# Patient Record
Sex: Female | Born: 1975 | Race: White | Hispanic: No | Marital: Married | Smoking: Never smoker
Health system: Southern US, Community
[De-identification: ages and names within clinical notes are randomized; demographics above are authoritative.]

## PROBLEM LIST (undated history)

## (undated) ENCOUNTER — Inpatient Hospital Stay (HOSPITAL_COMMUNITY): Payer: Self-pay

## (undated) DIAGNOSIS — E119 Type 2 diabetes mellitus without complications: Secondary | ICD-10-CM

## (undated) DIAGNOSIS — IMO0001 Reserved for inherently not codable concepts without codable children: Secondary | ICD-10-CM

## (undated) DIAGNOSIS — Z87898 Personal history of other specified conditions: Secondary | ICD-10-CM

## (undated) DIAGNOSIS — M65842 Other synovitis and tenosynovitis, left hand: Secondary | ICD-10-CM

## (undated) DIAGNOSIS — M65319 Trigger thumb, unspecified thumb: Secondary | ICD-10-CM

## (undated) DIAGNOSIS — Z794 Long term (current) use of insulin: Secondary | ICD-10-CM

## (undated) HISTORY — PX: WISDOM TOOTH EXTRACTION: SHX21

## (undated) HISTORY — PX: BREAST BIOPSY: SHX20

---

## 2009-01-21 ENCOUNTER — Inpatient Hospital Stay (HOSPITAL_COMMUNITY): Admission: AD | Admit: 2009-01-21 | Discharge: 2009-01-23 | Payer: Self-pay | Admitting: Obstetrics and Gynecology

## 2009-01-21 ENCOUNTER — Encounter (INDEPENDENT_AMBULATORY_CARE_PROVIDER_SITE_OTHER): Payer: Self-pay | Admitting: Obstetrics and Gynecology

## 2009-10-22 ENCOUNTER — Ambulatory Visit (HOSPITAL_COMMUNITY): Admission: RE | Admit: 2009-10-22 | Discharge: 2009-10-22 | Payer: Self-pay | Admitting: Obstetrics and Gynecology

## 2009-12-24 ENCOUNTER — Inpatient Hospital Stay (HOSPITAL_COMMUNITY): Admission: AD | Admit: 2009-12-24 | Discharge: 2009-12-27 | Payer: Self-pay | Admitting: Obstetrics and Gynecology

## 2009-12-27 ENCOUNTER — Encounter: Admission: RE | Admit: 2009-12-27 | Discharge: 2010-01-03 | Payer: Self-pay | Admitting: Obstetrics & Gynecology

## 2010-06-22 ENCOUNTER — Encounter: Payer: Self-pay | Admitting: Obstetrics and Gynecology

## 2010-08-16 LAB — GLUCOSE, CAPILLARY
Glucose-Capillary: 102 mg/dL — ABNORMAL HIGH (ref 70–99)
Glucose-Capillary: 104 mg/dL — ABNORMAL HIGH (ref 70–99)
Glucose-Capillary: 111 mg/dL — ABNORMAL HIGH (ref 70–99)
Glucose-Capillary: 147 mg/dL — ABNORMAL HIGH (ref 70–99)
Glucose-Capillary: 173 mg/dL — ABNORMAL HIGH (ref 70–99)
Glucose-Capillary: 62 mg/dL — ABNORMAL LOW (ref 70–99)
Glucose-Capillary: 83 mg/dL (ref 70–99)
Glucose-Capillary: 92 mg/dL (ref 70–99)

## 2010-08-16 LAB — CBC
Hemoglobin: 11.4 g/dL — ABNORMAL LOW (ref 12.0–15.0)
MCH: 32.3 pg (ref 26.0–34.0)
MCH: 32.9 pg (ref 26.0–34.0)
MCHC: 33.6 g/dL (ref 30.0–36.0)
MCV: 96.3 fL (ref 78.0–100.0)
MCV: 96.5 fL (ref 78.0–100.0)
RBC: 3.46 MIL/uL — ABNORMAL LOW (ref 3.87–5.11)
RDW: 13 % (ref 11.5–15.5)
RDW: 13.1 % (ref 11.5–15.5)
WBC: 10 10*3/uL (ref 4.0–10.5)
WBC: 10.2 10*3/uL (ref 4.0–10.5)

## 2010-08-16 LAB — RPR: RPR Ser Ql: NONREACTIVE

## 2010-08-16 LAB — GLUCOSE, RANDOM: Glucose, Bld: 70 mg/dL (ref 70–99)

## 2010-09-06 LAB — GLUCOSE, CAPILLARY
Glucose-Capillary: 127 mg/dL — ABNORMAL HIGH (ref 70–99)
Glucose-Capillary: 132 mg/dL — ABNORMAL HIGH (ref 70–99)
Glucose-Capillary: 150 mg/dL — ABNORMAL HIGH (ref 70–99)
Glucose-Capillary: 152 mg/dL — ABNORMAL HIGH (ref 70–99)
Glucose-Capillary: 235 mg/dL — ABNORMAL HIGH (ref 70–99)
Glucose-Capillary: 285 mg/dL — ABNORMAL HIGH (ref 70–99)
Glucose-Capillary: 68 mg/dL — ABNORMAL LOW (ref 70–99)
Glucose-Capillary: 71 mg/dL (ref 70–99)
Glucose-Capillary: 85 mg/dL (ref 70–99)

## 2010-09-06 LAB — CBC
HCT: 34.4 % — ABNORMAL LOW (ref 36.0–46.0)
HCT: 37.6 % (ref 36.0–46.0)
Hemoglobin: 11.6 g/dL — ABNORMAL LOW (ref 12.0–15.0)
Platelets: 181 10*3/uL (ref 150–400)
RBC: 3.66 MIL/uL — ABNORMAL LOW (ref 3.87–5.11)
WBC: 10.9 10*3/uL — ABNORMAL HIGH (ref 4.0–10.5)
WBC: 8.9 10*3/uL (ref 4.0–10.5)

## 2010-09-06 LAB — RPR: RPR Ser Ql: NONREACTIVE

## 2010-10-14 NOTE — Op Note (Signed)
Charlotte Hendricks, Charlotte Hendricks                ACCOUNT NO.:  [id number]   MEDICAL RECORD NO.:  [id number]          PATIENT TYPE:  INP   LOCATION:  9199                          FACILITY:  [facility]   PHYSICIAN:  Randye Lobo, M.D.   DATE OF BIRTH:  [birth year]   DATE OF PROCEDURE:  01/21/2009  DATE OF DISCHARGE:                               OPERATIVE REPORT   PREOPERATIVE DIAGNOSES:  1. Intrauterine gestation at 104 plus 2 weeks.  2. Class C diabetes mellitus.  3. Active labor.  4. History of prior cesarean section and shoulder dystocia.  5. Positive group B streptococcus status.   POSTOPERATIVE DIAGNOSES.:  1. Intrauterine gestation at 37 plus 2 weeks.  2. Class C diabetes mellitus.  3. Active labor.  4. History of prior cesarean section and shoulder dystocia.  5. Positive group B streptococcus status.   PROCEDURES:  Primary low-segment transverse cesarean section.   SURGEON:  Randye Lobo, MD   ASSISTANT:  Gretchen Short, PA-C   ANESTHESIA:  Spinal.   IV FLUIDS:  1500 mL Ringer lactate.   ESTIMATED BLOOD LOSS:  600 mL.   URINE OUTPUT:  100 mL.   COMPLICATIONS:  None.   INDICATIONS FOR THE PROCEDURE:  The patient is a 35 year old gravida 3,  para 2-0-0-2 Caucasian female with an [facility] September 05/2009, who  presented early in the morning on January 21, 2009, with contractions.  The patient's prenatal course was significant for a class C diabetes  mellitus for which she is on an insulin pump.  The patient has a history  of a prior cesarean section due to a severe shoulder dystocia and has  planned for a repeat cesarean delivery on February 05, 2009.  The  patient is now 6 cm dilated and has had ruptured membranes.  Her group B  strep status is positive.  A plan is now made to proceed with an urgent  cesarean section after risks, benefits, and alternatives are reviewed.   FINDINGS:  A viable female was delivered at 4:39 a.m. with Apgars of 7  at 1 minute and 9 at 5 minutes.  The weight  was 10 pounds 2 ounces.  The  amniotic fluid was clear.  The cord pH was 7.09.  The patient had a  normal uterus, tubes, and ovaries.   SPECIMENS:  Placenta.   PROCEDURE:  The patient was evaluated briefly in the maternity  admissions area.  The fetal heart rate was in 140 range, when the  patient was escorted partially down to the operating room suite.  The  patient received a spinal anesthetic by Dr. Mal Amabile.  The patient  was then placed in a supine position with a left lateral tilt.  The  abdomen was sterilely prepped and draped and a Foley catheter had been  placed inside her bladder sterilely.   A Pfannenstiel incision was created sharply with a scalpel along the  patient's previous incision.  The incision was carried down to the  fascia with a scalpel and monopolar cautery was used for hemostasis.  The fascia was incised in the midline  and the incision was extended  bilaterally with a Mayo scissors.  The rectus muscles were sharply  divided in the midline.  The parietal peritoneum was elevated with 2  hemostat clamps and entered sharply.  The peritoneal incision was  extended cranially and caudally.   The lower uterine segment was exposed with a bladder retractor and a  bladder flap was sharply created.  A transverse lower uterine segment  incision was then created sharply with a scalpel and was extended  bluntly.  A hand was inserted through the uterine incision and the  vertex was delivered with the assistance of fundal pressure and  retraction of the upper lip of the lower uterine segment incision.  The  nares and mouth were suctioned, and the umbilical cord was doubly  clamped and cut.  The newborn was carried over to the awaiting  pediatricians in good condition.   A cord pH and cord blood were obtained and the placenta was expressed.  It was sent to pathology.   The uterus was exteriorized at this time, was wiped clean with a  moistened lap pad.  The uterine  incision was then closed with a double  layer closure of #1 chromic.  The first was a running locked layer and  the second was an imbricating layer.  There was a small amount of  bleeding in the vesicouterine fold which was treated with monopolar  cautery and then 2 figure-of-eight sutures of 3-0 Vicryl which created  good hemostasis.   The uterus was returned to the peritoneal cavity, which was irrigated  and suctioned.  The uterine incision was hemostatic and the abdomen was  therefore closed.   The parietal peritoneum was closed with a running suture of 3-0 Vicryl.  The rectus muscles were reapproximated with interrupted sutures of #1  chromic.  Part of the muscle had been dissected to create extra room  upon entry into the abdominal cavity and this was repaired as a  horizontal mattress suture.  The fascia was closed with a running suture  of 0 Vicryl.  The subcutaneous layer was irrigated and suctioned and  made hemostatic with monopolar cautery.  The skin was closed with  staples and sterile bandage was placed over this.   This concluded the surgery on [facility].  There were no  complications.  All needle, instrument, and sponge counts were correct.  The patient was escorted to the recovery room in stable and awake  condition.      Randye Lobo, M.D.  Electronically Signed     BES/MEDQ  D:  01/21/2009  T:  01/21/2009  Job:  [id number]

## 2010-11-25 NOTE — Consult Note (Signed)
NAMEMARSHEILA, Hendricks                ACCOUNT NO.:  [id number]  MEDICAL RECORD NO.:  [id number]          PATIENT TYPE:  OUT  LOCATION:  MFM                           [facility]:  [facility]  PHYSICIAN:  Charlotte L. Rachel Bo, MD   DATE OF BIRTH:  1975-09-24  DATE OF CONSULTATION:  10/22/2009 DATE OF DISCHARGE:  10/22/2009                                CONSULTATION  ADDRESS:  Charlotte Hendricks, M.D. [address], [address] [place] [address]  BODY:  Dear Dr. Edward Hendricks,  Thank you for referring your patient, Charlotte Hendricks, for maternal fetal medicine consultation.  As you are aware, Charlotte Hendricks is as 35 year old gravida 4, para 3-0-0-3 at approximately 27-5/7 weeks' gestation based on the first trimester ultrasound.  As you are also aware, Charlotte Hendricks has a history of type 1 diabetes diagnosed at age 75 for which she is currently treated with an insulin pump.  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  Charlotte Hendricks was feeling well today and had no specific complaints.  She reported good fetal movement and denied significant uterine activity, vaginal bleeding of loss of fluid per vagina.  Charlotte Hendricks is currently treated with insulin pump for her diabetes and follows closely with Charlotte Hendricks.  She reports seeing them approximately every 2 weeks during her pregnancy with fair to good control of her blood sugars.  She reports all values to be less than 200 with some intermittent elevations of periodically.  She had a hemoglobin A1c from earlier this month that was 8.3.  She reports improvement from her prepregnancy hemoglobin A1c which she placed to be approximately 9.38.  She reports practicing carbohydrate counting and states a good understanding of this.  She did not have a formal blood sugar log for review today.  In addition to the insulin pump, Charlotte Hendricks is also treated with Synthroid 50 mcg daily.  She reports that her endocrinologist also  manages her thyroid disease and thyroid studies earlier this month were also within normal limits including a TSH and free T4.  Charlotte Hendricks only other medication is prenatal vitamin.  She additionally has no other significant medical problems.  Charlotte Hendricks has no other significant medical or surgical history.  Her obstetric history is outlined below.  In 2007, Charlotte Hendricks had a vaginal delivery at 53 weeks' gestation of a 7 pound 10 ounce infant who is currently alive and well.  She reports no complications of pregnancy.  In 2008, Charlotte Hendricks underwent an emergent cesarean section for nonreassuring fetal heart rate tracing at 35 weeks' gestation.  The patient delivered a 9 pound 1 ounce female infant who is also currently alive and well.  In 2010, Charlotte Hendricks underwent a repeat cesarean section at 26 weeks' gestation of a 10 pound 2 ounces female infant and is currently doing well.  She reports fair control of her blood sugars in her previous pregnancies, but does state some difficulty in maintaining her blood sugars in target range.  Charlotte Hendricks has no history of STDs or uterine or cervical procedures.  She  does not smoke, use alcohol or street drugs.  She has no personal or family history of thromboembolic disease.  There are also no genetic conditions or congenital anomalies that run in either her or the father of the baby's family.  Charlotte Hendricks does state that one of her children screened equivocal on a newborn screening for galactosemia.  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  She has received extensive counseling regarding this diagnosis and is not interested in any further counseling or diagnostic testing or screening for family members related to this condition.  Review of systems today was negative.  Charlotte Hendricks has had a normal first trimester screen to assess for aneuploidy risks and her MSAFP is also within normal limits during the  pregnancy.  On exam today, Charlotte Hendricks's blood pressure was 115/64.  Her pulse was 79 beats per minute and her weight was 165 pounds.  The implications of diabetes in pregnancy were discussed with Charlotte Hendricks at length today.  As she had diabetes with her three prior pregnancies, she reported familiarity with all of this information and denied any questions or desire for further diabetes education or nutrition counseling.  It was discussed that diabetes can be associated with congenital anomalies.  Charlotte Hendricks. Charlotte Hendricks has had an normal fetal anatomic survey through your office.  We also recommended that she undergo a fetal echo due to the increased risk of congenital cardiac disease with diabetes. She is interested in pursuing this option and this was scheduled for her today.  The increased risk for fetal growth abnormalities including growth restriction, as well as macrosomia, was discussed with Charlotte Hendricks. Charlotte Hendricks, and we would recommend serial ultrasounds approximately every 3 to 4 weeks for the remainder of gestation for evaluation of fetal growth and amniotic fluid volume.  In addition, there is an increased risk for fetal demise and placental insufficiency, and thus, at 32 weeks antenatal testing is recommended until delivery.  I would recommend that Charlotte Hendricks. Charlotte Hendricks remain in close followup with her endocrinologist.  Target blood sugar values including fasting values 90 or less, and premeal and 2-hour post-meal blood sugars of 120 or less were discussed.  The newborn metabolic implications of maternal diabetes were also discussed including but not limited to hypoglycemia.  Charlotte Hendricks. Charlotte Hendricks is aware of the need to have a pediatric team present at the time of delivery and of close observation in the immediate newborn period.  Charlotte Hendricks. Charlotte Hendricks reports that she is planning an ophthalmologic exam, but has never had a history of retinopathy.  She is also aware of the increased risk of preeclampsia associated with diabetes.  Close  surveillance for development of this condition is recommended and signs of preeclampsia review today.  Of note, Charlotte Hendricks. Braaten has had two prior macrosomic infants and is likely at risk for similar fetal outcome with this pregnancy.  Given she has had two prior cesarean sections  with some difficulty controlling her diabetes, repeat cesarean section is likely the best mode of delivery for her.  If maternal-fetal status remains stable with reassuring antenatal testing, this is recommended at 39 weeks' gestation.  Charlotte Hendricks. Squyres will likely experience decrease in her insulin requirements post-delivery and in general we recommend reducing insulin to one-half the pregnancy dose in the immediate postpartum period.  Charlotte Hendricks. Elenes should also be managed with an insulin drip during labor to maintain blood sugars between 70 and 120.  The implications of hypothyroidism in pregnancy were also discussed  with Charlotte Hendricks. [place].  The benefits of maintaining a euthyroid state during pregnancy were discussed and the need to check thyroid function studies at least every trimester was reviewed.  It will also be of benefit to evaluate Charlotte Hendricks. Vitanza's thyroid disease and at her 6-week postpartum visit, as thyroid status change during this period of time.  Charlotte Hendricks. Hosie has had a normal first trimester screen to assess for aneuploidy risks and her MSAFP is also within normal limits during the pregnancy.  Thank you for allowing [place] to participate in the care of Charlotte Hendricks. [place]. Please feel free to contact us at any time regarding this or any other patient you may have.  Sincerely,          ______________________________ Macarthur Critchley. Rachel Bo, MD    HLM/MEDQ  D:  11/21/2009  T:  11/21/2009  Job:  [id number]  cc:   Charlotte Hendricks, M.D. Fax: [phone]  Electronically Signed by Rica Koyanagi MD on 11/25/2010 10:31:57 AM

## 2011-06-10 ENCOUNTER — Other Ambulatory Visit (HOSPITAL_COMMUNITY): Payer: Self-pay | Admitting: Nurse Practitioner

## 2011-06-10 DIAGNOSIS — O09529 Supervision of elderly multigravida, unspecified trimester: Secondary | ICD-10-CM

## 2011-06-10 DIAGNOSIS — O24919 Unspecified diabetes mellitus in pregnancy, unspecified trimester: Secondary | ICD-10-CM

## 2011-06-11 ENCOUNTER — Other Ambulatory Visit: Payer: Self-pay

## 2011-06-22 ENCOUNTER — Other Ambulatory Visit: Payer: Self-pay

## 2011-07-28 ENCOUNTER — Ambulatory Visit (HOSPITAL_COMMUNITY): Payer: Managed Care, Other (non HMO)

## 2011-08-03 ENCOUNTER — Ambulatory Visit (HOSPITAL_COMMUNITY)
Admission: RE | Admit: 2011-08-03 | Discharge: 2011-08-03 | Disposition: A | Discharge status: Home / Self Care | Payer: Managed Care, Other (non HMO) | Source: Ambulatory Visit | Attending: Nurse Practitioner | Admitting: Nurse Practitioner

## 2011-08-03 ENCOUNTER — Encounter (HOSPITAL_COMMUNITY): Payer: Self-pay

## 2011-08-03 DIAGNOSIS — O24919 Unspecified diabetes mellitus in pregnancy, unspecified trimester: Secondary | ICD-10-CM | POA: Insufficient documentation

## 2011-08-03 DIAGNOSIS — Z363 Encounter for antenatal screening for malformations: Secondary | ICD-10-CM | POA: Insufficient documentation

## 2011-08-03 DIAGNOSIS — O34219 Maternal care for unspecified type scar from previous cesarean delivery: Secondary | ICD-10-CM | POA: Insufficient documentation

## 2011-08-03 DIAGNOSIS — O352XX0 Maternal care for (suspected) hereditary disease in fetus, not applicable or unspecified: Secondary | ICD-10-CM | POA: Insufficient documentation

## 2011-08-03 DIAGNOSIS — O09529 Supervision of elderly multigravida, unspecified trimester: Secondary | ICD-10-CM | POA: Insufficient documentation

## 2011-08-03 DIAGNOSIS — Z1389 Encounter for screening for other disorder: Secondary | ICD-10-CM | POA: Insufficient documentation

## 2011-08-03 DIAGNOSIS — O358XX0 Maternal care for other (suspected) fetal abnormality and damage, not applicable or unspecified: Secondary | ICD-10-CM | POA: Insufficient documentation

## 2011-08-03 DIAGNOSIS — O337XX0 Maternal care for disproportion due to other fetal deformities, not applicable or unspecified: Secondary | ICD-10-CM | POA: Insufficient documentation

## 2011-09-25 ENCOUNTER — Other Ambulatory Visit (HOSPITAL_COMMUNITY): Payer: Self-pay | Admitting: Obstetrics and Gynecology

## 2011-09-25 DIAGNOSIS — O3660X0 Maternal care for excessive fetal growth, unspecified trimester, not applicable or unspecified: Secondary | ICD-10-CM

## 2011-09-29 ENCOUNTER — Ambulatory Visit (HOSPITAL_COMMUNITY)
Admission: RE | Admit: 2011-09-29 | Discharge: 2011-09-29 | Disposition: A | Discharge status: Home / Self Care | Payer: Managed Care, Other (non HMO) | Source: Ambulatory Visit | Attending: Obstetrics and Gynecology | Admitting: Obstetrics and Gynecology

## 2011-09-29 DIAGNOSIS — O34219 Maternal care for unspecified type scar from previous cesarean delivery: Secondary | ICD-10-CM | POA: Insufficient documentation

## 2011-09-29 DIAGNOSIS — O337XX0 Maternal care for disproportion due to other fetal deformities, not applicable or unspecified: Secondary | ICD-10-CM | POA: Insufficient documentation

## 2011-09-29 DIAGNOSIS — O3660X0 Maternal care for excessive fetal growth, unspecified trimester, not applicable or unspecified: Secondary | ICD-10-CM

## 2011-09-29 DIAGNOSIS — Z8751 Personal history of pre-term labor: Secondary | ICD-10-CM | POA: Insufficient documentation

## 2011-09-29 DIAGNOSIS — O24919 Unspecified diabetes mellitus in pregnancy, unspecified trimester: Secondary | ICD-10-CM | POA: Insufficient documentation

## 2011-09-29 DIAGNOSIS — O09529 Supervision of elderly multigravida, unspecified trimester: Secondary | ICD-10-CM | POA: Insufficient documentation

## 2011-09-29 DIAGNOSIS — O352XX0 Maternal care for (suspected) hereditary disease in fetus, not applicable or unspecified: Secondary | ICD-10-CM | POA: Insufficient documentation

## 2011-11-06 ENCOUNTER — Inpatient Hospital Stay (HOSPITAL_COMMUNITY)
Admission: AD | Admit: 2011-11-06 | Discharge: 2011-11-06 | Disposition: A | Discharge status: Home / Self Care | Payer: Managed Care, Other (non HMO) | Source: Ambulatory Visit | Attending: Obstetrics and Gynecology | Admitting: Obstetrics and Gynecology

## 2011-11-06 DIAGNOSIS — O4700 False labor before 37 completed weeks of gestation, unspecified trimester: Secondary | ICD-10-CM | POA: Insufficient documentation

## 2011-11-06 MED ORDER — BETAMETHASONE SOD PHOS & ACET 6 (3-3) MG/ML IJ SUSP
12.0000 mg | Freq: Once | INTRAMUSCULAR | Status: AC
  Administered 2011-11-06: 12 mg via INTRAMUSCULAR
  Filled 2011-11-06: qty 2

## 2011-11-06 MED ORDER — BETAMETHASONE SOD PHOS & ACET 6 (3-3) MG/ML IJ SUSP
12.0000 mg | Freq: Once | INTRAMUSCULAR | Status: DC
  Filled 2011-11-06: qty 2

## 2011-11-07 ENCOUNTER — Inpatient Hospital Stay (HOSPITAL_COMMUNITY)
Admission: AD | Admit: 2011-11-07 | Discharge: 2011-11-07 | Disposition: A | Discharge status: Home / Self Care | Payer: Managed Care, Other (non HMO) | Source: Ambulatory Visit | Attending: Obstetrics and Gynecology | Admitting: Obstetrics and Gynecology

## 2011-11-07 DIAGNOSIS — O4700 False labor before 37 completed weeks of gestation, unspecified trimester: Secondary | ICD-10-CM | POA: Insufficient documentation

## 2011-11-07 MED ORDER — BETAMETHASONE SOD PHOS & ACET 6 (3-3) MG/ML IJ SUSP
12.0000 mg | Freq: Once | INTRAMUSCULAR | Status: AC
  Administered 2011-11-07: 12 mg via INTRAMUSCULAR
  Filled 2011-11-07: qty 2

## 2011-11-13 ENCOUNTER — Ambulatory Visit (HOSPITAL_COMMUNITY)
Admission: RE | Admit: 2011-11-13 | Discharge: 2011-11-13 | Disposition: A | Discharge status: Home / Self Care | Payer: Managed Care, Other (non HMO) | Source: Ambulatory Visit | Attending: Obstetrics and Gynecology | Admitting: Obstetrics and Gynecology

## 2011-11-13 ENCOUNTER — Encounter (HOSPITAL_COMMUNITY): Payer: Self-pay

## 2011-11-13 ENCOUNTER — Other Ambulatory Visit (HOSPITAL_COMMUNITY): Payer: Self-pay | Admitting: Obstetrics and Gynecology

## 2011-11-13 DIAGNOSIS — Z8751 Personal history of pre-term labor: Secondary | ICD-10-CM | POA: Insufficient documentation

## 2011-11-13 DIAGNOSIS — O24919 Unspecified diabetes mellitus in pregnancy, unspecified trimester: Secondary | ICD-10-CM | POA: Insufficient documentation

## 2011-11-13 DIAGNOSIS — O352XX0 Maternal care for (suspected) hereditary disease in fetus, not applicable or unspecified: Secondary | ICD-10-CM | POA: Insufficient documentation

## 2011-11-13 DIAGNOSIS — O34219 Maternal care for unspecified type scar from previous cesarean delivery: Secondary | ICD-10-CM | POA: Insufficient documentation

## 2011-11-13 DIAGNOSIS — O24019 Pre-existing diabetes mellitus, type 1, in pregnancy, unspecified trimester: Secondary | ICD-10-CM

## 2011-11-13 DIAGNOSIS — O337XX0 Maternal care for disproportion due to other fetal deformities, not applicable or unspecified: Secondary | ICD-10-CM | POA: Insufficient documentation

## 2011-11-13 DIAGNOSIS — O09529 Supervision of elderly multigravida, unspecified trimester: Secondary | ICD-10-CM | POA: Insufficient documentation

## 2011-11-13 NOTE — ED Notes (Signed)
Pt wishing to go home at this time due to child care issues.  Dr. Ellyn Soroka and Dr. Claudean Severance made aware.  Ok for pt to go home and settle child care issues.  Would like for pt to come back to be admitted tonight.  Or by tomorrow morning at the latest.  Discussed this with pt and stressed the importance of returning asap.  Pt verbalized understanding.

## 2011-11-13 NOTE — ED Notes (Signed)
MD in to discuss [place] results with pt.  Will admit for diabetes management.  Antenatal charge nurse aware of admission.

## 2011-11-13 NOTE — Progress Notes (Signed)
Patient seen for BPP.  See report in [facility].  Alpha Gula, MD  Patient with type I DM on insulin pump, poor glycemic control.  Single IUP at 33 5/7 weeks Polyhydramnios with an AFI of 27.7 cm  Suspected fetal macrosomia with an EFW of 3897 g (>90th %) BPP of 8/8  Recommend inpatient observation to achieve improved diabetic control.  Discussed recommendations with Dr. Ellyn Robar - plan admission this weekend with the assistance of the diabetic team. Continue 2x weekly antepartum fetal testing.

## 2011-11-20 ENCOUNTER — Encounter (HOSPITAL_COMMUNITY)
Admission: AD | Disposition: A | Discharge status: Home / Self Care | Payer: Self-pay | Source: Ambulatory Visit | Attending: Obstetrics and Gynecology

## 2011-11-20 ENCOUNTER — Inpatient Hospital Stay (HOSPITAL_COMMUNITY)
Admission: AD | Admit: 2011-11-20 | Discharge: 2011-11-22 | DRG: 765 | Disposition: A | Discharge status: Home / Self Care | Payer: Managed Care, Other (non HMO) | Source: Ambulatory Visit | Attending: Obstetrics and Gynecology | Admitting: Obstetrics and Gynecology

## 2011-11-20 ENCOUNTER — Encounter (HOSPITAL_COMMUNITY): Payer: Self-pay | Admitting: *Deleted

## 2011-11-20 ENCOUNTER — Inpatient Hospital Stay (HOSPITAL_COMMUNITY): Payer: Managed Care, Other (non HMO) | Admitting: Anesthesiology

## 2011-11-20 ENCOUNTER — Encounter (HOSPITAL_COMMUNITY): Payer: Self-pay | Admitting: Obstetrics and Gynecology

## 2011-11-20 ENCOUNTER — Encounter (HOSPITAL_COMMUNITY): Payer: Self-pay | Admitting: Anesthesiology

## 2011-11-20 DIAGNOSIS — O09529 Supervision of elderly multigravida, unspecified trimester: Secondary | ICD-10-CM | POA: Diagnosis present

## 2011-11-20 DIAGNOSIS — O2432 Unspecified pre-existing diabetes mellitus in childbirth: Secondary | ICD-10-CM | POA: Diagnosis present

## 2011-11-20 DIAGNOSIS — O409XX0 Polyhydramnios, unspecified trimester, not applicable or unspecified: Secondary | ICD-10-CM | POA: Diagnosis present

## 2011-11-20 DIAGNOSIS — O34219 Maternal care for unspecified type scar from previous cesarean delivery: Principal | ICD-10-CM | POA: Diagnosis present

## 2011-11-20 DIAGNOSIS — Z98891 History of uterine scar from previous surgery: Secondary | ICD-10-CM

## 2011-11-20 DIAGNOSIS — E109 Type 1 diabetes mellitus without complications: Secondary | ICD-10-CM | POA: Insufficient documentation

## 2011-11-20 DIAGNOSIS — O42919 Preterm premature rupture of membranes, unspecified as to length of time between rupture and onset of labor, unspecified trimester: Secondary | ICD-10-CM

## 2011-11-20 LAB — CBC
HCT: 32.5 % — ABNORMAL LOW (ref 36.0–46.0)
MCHC: 31.7 g/dL (ref 30.0–36.0)
Platelets: 196 10*3/uL (ref 150–400)
RDW: 14.7 % (ref 11.5–15.5)
WBC: 9.2 10*3/uL (ref 4.0–10.5)

## 2011-11-20 LAB — GLUCOSE, CAPILLARY
Glucose-Capillary: 109 mg/dL — ABNORMAL HIGH (ref 70–99)
Glucose-Capillary: 168 mg/dL — ABNORMAL HIGH (ref 70–99)
Glucose-Capillary: 130 mg/dL — ABNORMAL HIGH (ref 70–99)
Glucose-Capillary: 114 mg/dL — ABNORMAL HIGH (ref 70–99)

## 2011-11-20 LAB — TYPE AND SCREEN: Antibody Screen: NEGATIVE

## 2011-11-20 LAB — CORD BLOOD GAS (ARTERIAL): Acid-base deficit: 6.6 mmol/L — ABNORMAL HIGH (ref 0.0–2.0)

## 2011-11-20 SURGERY — Surgical Case
Anesthesia: Regional | Site: Abdomen | Wound class: Clean Contaminated

## 2011-11-20 MED ORDER — SCOPOLAMINE 1 MG/3DAYS TD PT72
1.0000 | MEDICATED_PATCH | Freq: Once | TRANSDERMAL | Status: DC
  Administered 2011-11-20: 1.5 mg via TRANSDERMAL

## 2011-11-20 MED ORDER — INSULIN PUMP
Freq: Three times a day (TID) | SUBCUTANEOUS | Status: DC
  Administered 2011-11-20: 0.45 via SUBCUTANEOUS
  Administered 2011-11-21: 0.65 via SUBCUTANEOUS
  Filled 2011-11-20: qty 1

## 2011-11-20 MED ORDER — NALBUPHINE HCL 10 MG/ML IJ SOLN
5.0000 mg | INTRAMUSCULAR | Status: DC | PRN
  Filled 2011-11-20: qty 1

## 2011-11-20 MED ORDER — METOCLOPRAMIDE HCL 5 MG/ML IJ SOLN: 10.0000 mg | Freq: Three times a day (TID) | INTRAMUSCULAR | Status: DC | PRN

## 2011-11-20 MED ORDER — PRENATAL MULTIVITAMIN CH
ORAL_TABLET | Freq: Every day | ORAL | Status: DC
  Filled 2011-11-20: qty 1

## 2011-11-20 MED ORDER — MORPHINE SULFATE 0.5 MG/ML IJ SOLN
INTRAMUSCULAR | Status: AC
  Filled 2011-11-20: qty 10

## 2011-11-20 MED ORDER — ONDANSETRON HCL 4 MG/2ML IJ SOLN: 4.0000 mg | INTRAMUSCULAR | Status: DC | PRN

## 2011-11-20 MED ORDER — ONDANSETRON HCL 4 MG/2ML IJ SOLN
INTRAMUSCULAR | Status: DC | PRN
  Administered 2011-11-20: 4 mg via INTRAVENOUS

## 2011-11-20 MED ORDER — OXYTOCIN 10 UNIT/ML IJ SOLN
INTRAMUSCULAR | Status: AC
  Filled 2011-11-20: qty 4

## 2011-11-20 MED ORDER — CITRIC ACID-SODIUM CITRATE 334-500 MG/5ML PO SOLN
30.0000 mL | Freq: Once | ORAL | Status: AC
  Administered 2011-11-20: 30 mL via ORAL

## 2011-11-20 MED ORDER — MORPHINE SULFATE (PF) 0.5 MG/ML IJ SOLN
INTRAMUSCULAR | Status: DC | PRN
Start: 2011-11-20 — End: 2011-11-20
  Administered 2011-11-20: .15 mg via INTRATHECAL

## 2011-11-20 MED ORDER — LACTATED RINGERS IV SOLN
INTRAVENOUS | Status: DC
  Administered 2011-11-20: 12:00:00 via INTRAVENOUS

## 2011-11-20 MED ORDER — OXYCODONE-ACETAMINOPHEN 5-325 MG PO TABS
1.0000 | ORAL_TABLET | ORAL | Status: DC | PRN
  Administered 2011-11-20 – 2011-11-21 (×3): 1 via ORAL
  Administered 2011-11-21: 2 via ORAL
  Administered 2011-11-22 (×2): 1 via ORAL
  Filled 2011-11-20 (×2): qty 1
  Filled 2011-11-20: qty 2
  Filled 2011-11-20 (×3): qty 1

## 2011-11-20 MED ORDER — WITCH HAZEL-GLYCERIN EX PADS: 1.0000 "application " | MEDICATED_PAD | CUTANEOUS | Status: DC | PRN

## 2011-11-20 MED ORDER — DIPHENHYDRAMINE HCL 25 MG PO CAPS: 25.0000 mg | ORAL_CAPSULE | ORAL | Status: DC | PRN

## 2011-11-20 MED ORDER — SIMETHICONE 80 MG PO CHEW
80.0000 mg | CHEWABLE_TABLET | Freq: Three times a day (TID) | ORAL | Status: DC
  Administered 2011-11-20 – 2011-11-22 (×7): 80 mg via ORAL

## 2011-11-20 MED ORDER — MEPERIDINE HCL 25 MG/ML IJ SOLN: 6.2500 mg | INTRAMUSCULAR | Status: DC | PRN

## 2011-11-20 MED ORDER — PRENATAL MULTIVITAMIN CH
1.0000 | ORAL_TABLET | Freq: Every day | ORAL | Status: DC
  Administered 2011-11-20 – 2011-11-22 (×3): 1 via ORAL
  Filled 2011-11-20 (×3): qty 1

## 2011-11-20 MED ORDER — KETOROLAC TROMETHAMINE 30 MG/ML IJ SOLN
30.0000 mg | Freq: Four times a day (QID) | INTRAMUSCULAR | Status: AC | PRN
  Administered 2011-11-20: 30 mg via INTRAVENOUS

## 2011-11-20 MED ORDER — IBUPROFEN 800 MG PO TABS
800.0000 mg | ORAL_TABLET | Freq: Three times a day (TID) | ORAL | Status: DC
  Administered 2011-11-20 – 2011-11-22 (×6): 800 mg via ORAL
  Filled 2011-11-20 (×6): qty 1

## 2011-11-20 MED ORDER — SENNOSIDES-DOCUSATE SODIUM 8.6-50 MG PO TABS
2.0000 | ORAL_TABLET | Freq: Every day | ORAL | Status: DC
  Administered 2011-11-20 – 2011-11-21 (×2): 2 via ORAL

## 2011-11-20 MED ORDER — LANOLIN HYDROUS EX OINT: 1.0000 "application " | TOPICAL_OINTMENT | CUTANEOUS | Status: DC | PRN

## 2011-11-20 MED ORDER — OXYTOCIN 40 UNITS IN LACTATED RINGERS INFUSION - SIMPLE MED: 62.5000 mL/h | INTRAVENOUS | Status: AC

## 2011-11-20 MED ORDER — ONDANSETRON HCL 4 MG PO TABS: 4.0000 mg | ORAL_TABLET | ORAL | Status: DC | PRN

## 2011-11-20 MED ORDER — NALOXONE HCL 0.4 MG/ML IJ SOLN: 0.4000 mg | INTRAMUSCULAR | Status: DC | PRN

## 2011-11-20 MED ORDER — KETOROLAC TROMETHAMINE 30 MG/ML IJ SOLN
INTRAMUSCULAR | Status: AC
  Filled 2011-11-20: qty 1

## 2011-11-20 MED ORDER — EPHEDRINE SULFATE 50 MG/ML IJ SOLN
INTRAMUSCULAR | Status: DC | PRN
  Administered 2011-11-20 (×5): 10 mg via INTRAVENOUS

## 2011-11-20 MED ORDER — SODIUM CHLORIDE 0.9 % IJ SOLN: 3.0000 mL | INTRAMUSCULAR | Status: DC | PRN

## 2011-11-20 MED ORDER — FENTANYL CITRATE 0.05 MG/ML IJ SOLN: 25.0000 ug | INTRAMUSCULAR | Status: DC | PRN

## 2011-11-20 MED ORDER — ONDANSETRON HCL 4 MG/2ML IJ SOLN: 4.0000 mg | Freq: Three times a day (TID) | INTRAMUSCULAR | Status: DC | PRN

## 2011-11-20 MED ORDER — LACTATED RINGERS IV BOLUS (SEPSIS)
1000.0000 mL | Freq: Once | INTRAVENOUS | Status: AC
  Administered 2011-11-20: 1000 mL via INTRAVENOUS

## 2011-11-20 MED ORDER — SODIUM CHLORIDE 0.9 % IV SOLN
1.0000 ug/kg/h | INTRAVENOUS | Status: DC | PRN
  Filled 2011-11-20: qty 2.5

## 2011-11-20 MED ORDER — ONDANSETRON HCL 4 MG/2ML IJ SOLN
INTRAMUSCULAR | Status: AC
  Filled 2011-11-20: qty 2

## 2011-11-20 MED ORDER — CITRIC ACID-SODIUM CITRATE 334-500 MG/5ML PO SOLN
ORAL | Status: AC
  Filled 2011-11-20: qty 15

## 2011-11-20 MED ORDER — SCOPOLAMINE 1 MG/3DAYS TD PT72
MEDICATED_PATCH | TRANSDERMAL | Status: AC
  Filled 2011-11-20: qty 1

## 2011-11-20 MED ORDER — INSULIN INFUSION PUMP DEVI: Status: DC

## 2011-11-20 MED ORDER — MENTHOL 3 MG MT LOZG: 1.0000 | LOZENGE | OROMUCOSAL | Status: DC | PRN

## 2011-11-20 MED ORDER — KETOROLAC TROMETHAMINE 30 MG/ML IJ SOLN: 30.0000 mg | Freq: Four times a day (QID) | INTRAMUSCULAR | Status: AC | PRN

## 2011-11-20 MED ORDER — ZOLPIDEM TARTRATE 5 MG PO TABS: 5.0000 mg | ORAL_TABLET | Freq: Every evening | ORAL | Status: DC | PRN

## 2011-11-20 MED ORDER — TETANUS-DIPHTH-ACELL PERTUSSIS 5-2.5-18.5 LF-MCG/0.5 IM SUSP
0.5000 mL | Freq: Once | INTRAMUSCULAR | Status: AC
  Administered 2011-11-21: 0.5 mL via INTRAMUSCULAR

## 2011-11-20 MED ORDER — BUPIVACAINE IN DEXTROSE 0.75-8.25 % IT SOLN
INTRATHECAL | Status: DC | PRN
  Administered 2011-11-20: 15 mg via INTRATHECAL

## 2011-11-20 MED ORDER — GENTAMICIN SULFATE 40 MG/ML IJ SOLN
INTRAVENOUS | Status: AC
  Administered 2011-11-20: 05:00:00 via INTRAVENOUS
  Filled 2011-11-20: qty 2.5

## 2011-11-20 MED ORDER — FENTANYL CITRATE 0.05 MG/ML IJ SOLN
INTRAMUSCULAR | Status: AC
  Filled 2011-11-20: qty 2

## 2011-11-20 MED ORDER — DIPHENHYDRAMINE HCL 25 MG PO CAPS: 25.0000 mg | ORAL_CAPSULE | Freq: Four times a day (QID) | ORAL | Status: DC | PRN

## 2011-11-20 MED ORDER — DIPHENHYDRAMINE HCL 50 MG/ML IJ SOLN: 25.0000 mg | INTRAMUSCULAR | Status: DC | PRN

## 2011-11-20 MED ORDER — SIMETHICONE 80 MG PO CHEW: 80.0000 mg | CHEWABLE_TABLET | ORAL | Status: DC | PRN

## 2011-11-20 MED ORDER — FENTANYL CITRATE 0.05 MG/ML IJ SOLN
INTRAMUSCULAR | Status: DC | PRN
  Administered 2011-11-20: 25 ug via INTRATHECAL

## 2011-11-20 MED ORDER — OXYTOCIN 20 UNITS IN LACTATED RINGERS INFUSION - SIMPLE
INTRAVENOUS | Status: DC | PRN
  Administered 2011-11-20: 40 [IU] via INTRAVENOUS

## 2011-11-20 MED ORDER — DIBUCAINE 1 % RE OINT: 1.0000 "application " | TOPICAL_OINTMENT | RECTAL | Status: DC | PRN

## 2011-11-20 MED ORDER — LACTATED RINGERS IV SOLN
INTRAVENOUS | Status: DC
  Administered 2011-11-20 (×3): via INTRAVENOUS

## 2011-11-20 MED ORDER — PHENYLEPHRINE HCL 10 MG/ML IJ SOLN
INTRAMUSCULAR | Status: DC | PRN
  Administered 2011-11-20: 40 ug via INTRAVENOUS
  Administered 2011-11-20 (×2): 80 ug via INTRAVENOUS

## 2011-11-20 MED ORDER — DIPHENHYDRAMINE HCL 50 MG/ML IJ SOLN: 12.5000 mg | INTRAMUSCULAR | Status: DC | PRN

## 2011-11-20 SURGICAL SUPPLY — 35 items
BENZOIN TINCTURE PRP APPL 2/3 (GAUZE/BANDAGES/DRESSINGS) ×2 IMPLANT
CHLORAPREP W/TINT 26ML (MISCELLANEOUS) ×2 IMPLANT
CLOTH BEACON ORANGE TIMEOUT ST (SAFETY) ×2 IMPLANT
CONTAINER PREFILL 10% NBF 15ML (MISCELLANEOUS) IMPLANT
DRSG COVADERM 4X10 (GAUZE/BANDAGES/DRESSINGS) ×2 IMPLANT
ELECT REM PT RETURN 9FT ADLT (ELECTROSURGICAL) ×2
ELECTRODE REM PT RTRN 9FT ADLT (ELECTROSURGICAL) ×1 IMPLANT
EXTRACTOR VACUUM M CUP 4 TUBE (SUCTIONS) IMPLANT
GLOVE BIO SURGEON STRL SZ 6.5 (GLOVE) ×2 IMPLANT
GLOVE BIO SURGEON STRL SZ7 (GLOVE) ×2 IMPLANT
GOWN PREVENTION PLUS LG XLONG (DISPOSABLE) ×6 IMPLANT
KIT ABG SYR 3ML LUER SLIP (SYRINGE) ×2 IMPLANT
NEEDLE HYPO 25X5/8 SAFETYGLIDE (NEEDLE) ×2 IMPLANT
NS IRRIG 1000ML POUR BTL (IV SOLUTION) ×2 IMPLANT
PACK C SECTION WH (CUSTOM PROCEDURE TRAY) ×2 IMPLANT
RTRCTR C-SECT PINK 25CM LRG (MISCELLANEOUS) ×2 IMPLANT
SLEEVE SCD COMPRESS KNEE MED (MISCELLANEOUS) IMPLANT
STAPLER VISISTAT 35W (STAPLE) IMPLANT
STRIP CLOSURE SKIN 1/2X4 (GAUZE/BANDAGES/DRESSINGS) ×2 IMPLANT
SUT MNCRL 0 VIOLET CTX 36 (SUTURE) ×2 IMPLANT
SUT MONOCRYL 0 CTX 36 (SUTURE) ×2
SUT PLAIN 1 NONE 54 (SUTURE) IMPLANT
SUT PLAIN 2 0 XLH (SUTURE) ×2 IMPLANT
SUT VIC AB 0 CT1 27 (SUTURE) ×2
SUT VIC AB 0 CT1 27XBRD ANBCTR (SUTURE) ×2 IMPLANT
SUT VIC AB 0 CT1 36 (SUTURE) ×2 IMPLANT
SUT VIC AB 2-0 CT1 27 (SUTURE) ×1
SUT VIC AB 2-0 CT1 TAPERPNT 27 (SUTURE) ×1 IMPLANT
SUT VIC AB 3-0 FS2 27 (SUTURE) ×2 IMPLANT
SUT VIC AB 3-0 SH 27 (SUTURE) ×1
SUT VIC AB 3-0 SH 27X BRD (SUTURE) ×1 IMPLANT
SYR BULB IRRIGATION 50ML (SYRINGE) ×2 IMPLANT
TOWEL OR 17X24 6PK STRL BLUE (TOWEL DISPOSABLE) ×4 IMPLANT
TRAY FOLEY CATH 14FR (SET/KITS/TRAYS/PACK) ×2 IMPLANT
WATER STERILE IRR 1000ML POUR (IV SOLUTION) ×2 IMPLANT

## 2011-11-20 NOTE — Addendum Note (Signed)
Addendum  created 11/20/11 1629 by Christene Lye, CRNA   Modules edited:Notes Section

## 2011-11-20 NOTE — Anesthesia Preprocedure Evaluation (Signed)
Anesthesia Evaluation  Patient identified by MRN, date of birth, ID band Patient awake    Reviewed: Allergy & Precautions, H&P , NPO status , Patient's Chart, lab work & pertinent test results  Airway Mallampati: III TM Distance: >3 FB Neck ROM: Full    Dental No notable dental hx. (+) Teeth Intact   Pulmonary  breath sounds clear to auscultation  Pulmonary exam normal       Cardiovascular negative cardio ROS  Rhythm:Regular Rate:Normal     Neuro/Psych negative neurological ROS  negative psych ROS   GI/Hepatic negative GI ROS, Neg liver ROS,   Endo/Other  Diabetes mellitus-, Poorly Controlled, Type 1, Insulin DependentOn Insulin pump. Last CBG 109mg %  Renal/GU negative Renal ROS  negative genitourinary   Musculoskeletal negative musculoskeletal ROS (+)   Abdominal   Peds  Hematology negative hematology ROS (+)   Anesthesia Other Findings   Reproductive/Obstetrics (+) Pregnancy Fetal Macrosomia                           Anesthesia Physical Anesthesia Plan  ASA: II and Emergent  Anesthesia Plan: Combined Spinal and Epidural   Post-op Pain Management:    Induction:   Airway Management Planned: Natural Airway  Additional Equipment:   Intra-op Plan:   Post-operative Plan:   Informed Consent: I have reviewed the patients History and Physical, chart, labs and discussed the procedure including the risks, benefits and alternatives for the proposed anesthesia with the patient or authorized representative who has indicated his/her understanding and acceptance.     Plan Discussed with: CRNA, Anesthesiologist and Surgeon  Anesthesia Plan Comments:         Anesthesia Quick Evaluation

## 2011-11-20 NOTE — Anesthesia Postprocedure Evaluation (Signed)
  Anesthesia Post-op Note  Patient: Charlotte Hendricks  Procedure(s) Performed: Procedure(s) (LRB): CESAREAN SECTION (N/A)  Patient Location: Nursing Unit  Anesthesia Type: Epidural  Level of Consciousness: awake, alert  and oriented  Airway and Oxygen Therapy: Patient Spontanous Breathing  Post-op Pain: none  Post-op Assessment: Post-op Vital signs reviewed, Patient's Cardiovascular Status Stable, No headache, No backache and No residual numbness  Post-op Vital Signs: Reviewed and stable  Complications: No apparent anesthesia complications

## 2011-11-20 NOTE — Op Note (Signed)
Charlotte Hendricks, Charlotte Hendricks NO.:  [id number]  MEDICAL RECORD NO.:  [id number]  LOCATION:  [facility]                          [facility]  PHYSICIAN:  Sherron Monday, MD        DATE OF BIRTH:  [birth year]  DATE OF PROCEDURE:  11/20/2011 DATE OF DISCHARGE:                              OPERATIVE REPORT   PREOPERATIVE DIAGNOSES:  History of low-transverse cesarean section x3, intrauterine pregnancy at 34+ weeks, ruptured membranes.  POSTOPERATIVE DIAGNOSIS:  History of low-transverse cesarean section x3, intrauterine pregnancy at 34+ weeks, ruptured membranes, delivered.  PROCEDURE:  Repeat low transverse cesarean section.  SURGEON:  Sherron Monday, MD  ANESTHESIA:  Combined spinal epidural.  ESTIMATED BLOOD LOSS:  800.  IV FLUIDS:  1500.  URINE OUTPUT:  150 mL clear urine at the end of the procedure.  PATHOLOGY:  Placenta to Pathology.  COMPLICATIONS:  None.  FINDINGS:  Viable female infant at 5:52 a.m. with Apgars and weight pending at the time of dictation.  Normal uterus, tubes, and ovaries are noted.  DISPOSITION:  Stable PACU.  PROCEDURE:  After informed consent was reviewed with the patient including risks, benefits, and alternatives of the surgical procedure, she was transported to the OR room and placed on the table where combined spinal and epidural anesthesia was placed and found to be adequate.  She was then prepped and draped in the normal sterile fashion after being placed in supine position with a leftward tilt. Pfannenstiel skin incision was made at the level of her previous incision, carried through to the underlying layer of fascia sharply. The fascia was incised in the midline.  The incision was extended laterally with Mayo scissors.  The inferior aspect of the fascial incision was grasped with Kocher clamps elevating, the rectus muscles were dissected off both bluntly and sharply.  Attention was then turned to the superior portion which in a  similar fashion, was grasped with clamps elevating the rectus muscles were dissected both bluntly and sharply.  Midline was identified and entered with the aid of a hemostat.  The incision extended superiorly and inferiorly with good visualization of the bladder.  Alexis skin retractor was placed carefully making sure no bowel was entrapped.  The uterus was inspected and the vesicouterine peritoneum was identified, tented up with smooth pickups, and the bladder flap was created both digitally and sharply.  Uterus was incised in a transverse fashion.  Infant was delivered from vertex presentation.  Nose and mouth were suctioned on the field.  Cord was clamped and cut.  Infant was handed off to the awaiting pediatric staff.  The placenta was then expressed.  Sent to Pathology.  The uterus was cleared of all clot and debris.  The uterine incision was closed with 2 layers, the first of which is a running locked and second is an imbricating layer.  Copious pelvic irrigation was performed.  The incision was found to be hemostatic.  The peritoneum was closed with 2-0 Vicryl in a running fashion.  The fascial planes were inspected and found to be hemostatic. The fascia was closed with a single suture of 0 Vicryl in a running fashion.  The  subcuticular adipose layer was irrigated and made hemostatic with Bovie cautery.  The dead space was closed with clear 3-0 plain gut.  The skin was closed with 3-0 Vicryl in a subcuticular fashion.  Sponge, lap, needle count was correct x2 per the operating staff.  The patient tolerated procedure well.     Sherron Monday, MD     JB/MEDQ  D:  11/20/2011  T:  11/20/2011  Job:  [id number]

## 2011-11-20 NOTE — Progress Notes (Addendum)
Patient ID: Charlotte Hendricks, female   DOB: 1976/04/07, 37 y.o.   MRN: [id number] POD #0  Doing okay, seen in PACU. Baby to NICU doing OK, high flow and abx, 9#5.7 Pt to call Dr. Foy [place] for help with insulin pump and changes. Will check sugars fasting AC and 1 hr PP Routine PP/postop care  Circ d/w pt including r/b/a - pt unsure

## 2011-11-20 NOTE — Anesthesia Postprocedure Evaluation (Signed)
Anesthesia Post Note  Patient: Charlotte Hendricks  Procedure(s) Performed: Procedure(s) (LRB): CESAREAN SECTION (N/A)  Anesthesia type: Spinal  Patient location: PACU  Post pain: Pain level controlled  Post assessment: Post-op Vital signs reviewed  Last Vitals:  Filed Vitals:   11/20/11 0730  BP: 100/45  Pulse: 61  Temp:   Resp: 15    Post vital signs: Reviewed  Level of consciousness: awake  Complications: No apparent anesthesia complications

## 2011-11-20 NOTE — H&P (Addendum)
Charlotte Hendricks is a 36 y.o. female [phone] @ 34+ with SROM for repeat LTCS.  D/w pt r/b/a will proceed.  Pregnancy complicated by poor glycemic control, poly, EFW 6/14 8#9.  S/p BMZ .Pt refused admission for better glycemic control per MFM recc's Maternal Medical History:  Reason for admission: Reason for admission: rupture of membranes.  Contractions: Onset was 1-2 hours ago.   Frequency: regular.   Perceived severity is strong.    Fetal activity: Perceived fetal activity is normal.    Prenatal Complications - Diabetes: type 1. Diabetes is managed by insulin pump.      OB History    Grav Para Term Preterm Abortions TAB SAB Ect Mult Living   5 4 1 3  0 0 0 0 0 4    G1 SVD female, shoulder dystocia 7#10, G2 35 wk LTCS female 9#, G3 36wk LTCS female 10#, G4 37wlk LTCS 8#, G5 present EFW 8#9 6/14, no abn pap, no STD Past Medical History  Diagnosis Date  . Diabetes mellitus     Type I   Past Surgical History  Procedure Date  . Cesarean section   . Breast biopsy   . Wisdom tooth extraction    Family History: family history includes Hyperlipidemia in her father.CAD, HTN Social History:  reports that she has never smoked. She does not have any smokeless tobacco history on file. She reports that she does not drink alcohol or use illicit drugs.married Meds insulin pump. PNV All griseofulvin, Keflex  Prenatal Transfer Tool  Maternal Diabetes: Yes:  Diabetes Type:  Pre-pregnancy, Insulin/Medication controlled Genetic Screening: Normal Maternal Ultrasounds/Referrals: Abnormal:  Findings:   Other: Please see prenatal record for details Fetal Ultrasounds or other Referrals:  Fetal echo Maternal Substance Abuse:  No Significant Maternal Medications:  Meds include: Other: see prenatal record Significant Maternal Lab Results:  Lab values include: Group B Strep positive Other Comments:  s/p BMZ, delalutin, insulin  Review of Systems  Constitutional: Negative.   HENT: Negative.   Eyes:  Negative.   Respiratory: Negative.   Cardiovascular: Negative.   Gastrointestinal: Negative.   Genitourinary: Negative.   Musculoskeletal: Negative.   Skin: Negative.   Neurological: Negative.   Psychiatric/Behavioral: Negative.       Blood pressure 118/79, pulse 91, temperature 99.3 F (37.4 C), temperature source Oral, resp. rate 16, height 5\' 6"  (1.676 m), weight 79.833 kg (176 lb), last menstrual period 03/22/2011, SpO2 100.00%, not currently breastfeeding. Maternal Exam:  Uterine Assessment: Contraction strength is firm.  Contraction frequency is regular.   Abdomen: Surgical scars: low transverse.   Fundal height is LGA .   Estimated fetal weight is 9#+.       Physical Exam  Constitutional: She is oriented to person, place, and time. She appears well-developed and well-nourished.  HENT:  Head: Normocephalic and atraumatic.  Eyes: Conjunctivae are normal. Pupils are equal, round, and reactive to light.  Neck: Normal range of motion. Neck supple. No thyromegaly present.  Cardiovascular: Normal rate and regular rhythm.   Respiratory: Effort normal and breath sounds normal. No respiratory distress.  GI: Soft. Bowel sounds are normal. There is no tenderness.  Musculoskeletal: Normal range of motion.  Neurological: She is alert and oriented to person, place, and time.  Skin: Skin is warm and dry.  Psychiatric: She has a normal mood and affect. Her behavior is normal.    Prenatal labs: ABO, Rh:  A+ Antibody:  neg Rubella:  immune RPR:   NR HBsAg:   neg HIV:  NR GBS:   positive in urine Hgb 12.2/Pap WNL HR HPV neg/ Plt 374 K/ GC neg/ Chl neg/ CF declined/ First Tri Screen WNL   9wk [place] cwd Nl anat [place], ant plac 6/14 EFW 8#9, poly 27  Assessment/Plan: 35yo [id number] @ 34+ with SROM for rLTCS also DM1 questionable control. For LTCS NICU and anesthesia aware  D/w pt R/B/A  BOVARD,Vannary Greening 11/20/2011, 4:08 AM

## 2011-11-20 NOTE — Anesthesia Procedure Notes (Signed)
Spinal  Patient location during procedure: OR Start time: 11/20/2011 5:27 AM Staffing Anesthesiologist: Malen Gauze, Jamaine Quintin A. Preanesthetic Checklist Completed: patient identified, site marked, surgical consent, pre-op evaluation, timeout performed, IV checked, risks and benefits discussed and monitors and equipment checked Spinal Block Patient position: sitting Prep: site prepped and draped and DuraPrep Patient monitoring: cardiac monitor, continuous pulse ox, blood pressure and heart rate Approach: midline Location: L3-4 Injection technique: catheter Needle Needle type: Tuohy and Sprotte  Needle gauge: 24 G Needle length: 12.7 cm Needle insertion depth: 6 cm Catheter type: closed end flexible Catheter size: 19 g Assessment Sensory level: T4 Additional Notes Patient tolerated procedure well. Transient paresthesia right leg and right foot, resolved after repostioning of needle.Adequate sensory level.

## 2011-11-20 NOTE — Transfer of Care (Signed)
Immediate Anesthesia Transfer of Care Note  Patient: Charlotte Hendricks  Procedure(s) Performed: Procedure(s) (LRB): CESAREAN SECTION (N/A)  Patient Location: PACU  Anesthesia Type: Spinal  Level of Consciousness: awake  Airway & Oxygen Therapy: Patient Spontanous Breathing  Post-op Assessment: Report given to PACU RN and Post -op Vital signs reviewed and stable  Post vital signs: stable  Complications: No apparent anesthesia complications

## 2011-11-20 NOTE — MAU Note (Signed)
SROM @ 0215 clear fluid.

## 2011-11-20 NOTE — Brief Op Note (Signed)
11/20/2011  6:39 AM  PATIENT:  Charlotte Hendricks  36 y.o. female  PRE-OPERATIVE DIAGNOSIS:  Previous Cesarean Section, Ruptured Membranes  POST-OPERATIVE DIAGNOSIS:  Previous Cesarean Section, Ruptured Membranes  PROCEDURE:  Procedure(s) (LRB): CESAREAN SECTION (N/A)  SURGEON:  Surgeon(s) and Role:    * Sherron Monday, MD - Primary  ANESTHESIA:   CSE  EBL:  Total I/O In: 1500 [I.V.:1500] Out: 950 [Urine:150; Blood:800]  BLOOD ADMINISTERED:none  DRAINS: none and Urinary Catheter (Foley)   LOCAL MEDICATIONS USED:  NONE  SPECIMEN:  Source of Specimen:  Placenta  DISPOSITION OF SPECIMEN:  PATHOLOGY  FINDINGS: viable female infant at 5:52 apgars P, Weight P, nl uterus, tubes and ovaries  COUNTS:  YES  TOURNIQUET:  * No tourniquets in log *  DICTATION: .Other Dictation: Dictation Number [id number]  PLAN OF CARE: Admit to inpatient   PATIENT DISPOSITION:  PACU - hemodynamically stable.   Delay start of Pharmacological VTE agent (>24hrs) due to surgical blood loss or risk of bleeding: not applicable

## 2011-11-20 NOTE — Consult Note (Signed)
Neonatology Note:   Attendance at C-section:    I was asked to attend this repeat C/S at 34 5/7 weeks due to SROM and previous C/S. The mother is a G5P4 A pos, GBS pos with poorly-controlled Type I DM on an insulin pump. There has been polyhydramnios and the baby is macrosomic. Fetal ultrasound showed left ventricular hypertrophy.  ROM 4 hours prior to delivery, fluid clear. Infant floppy, apneic, and dusky, but with good HR. We did bulb suctioning and gave vigorous stimulation, but he took one breath, then became apneic again, so PPV was applied for about 30-45 seconds. He responded quickly with improved color and starting to cry at about 2 minutes of age. He continued sustained crying after that and was pink in room air. Tone remained decreased until about 10 minutes. Ap 3/8/9. Lungs clear to ausc in DR. Held briefly by mother in [place], viewed by father en route to NICU (he had other children with him). Transported to NICU in room air, but was noted to become increasingly tachypnic at admission.   Deatra James, MD

## 2011-11-21 LAB — GLUCOSE, CAPILLARY
Glucose-Capillary: 119 mg/dL — ABNORMAL HIGH (ref 70–99)
Glucose-Capillary: 111 mg/dL — ABNORMAL HIGH (ref 70–99)
Glucose-Capillary: 139 mg/dL — ABNORMAL HIGH (ref 70–99)

## 2011-11-21 LAB — CBC
Hemoglobin: 8.4 g/dL — ABNORMAL LOW (ref 12.0–15.0)
MCH: 25.1 pg — ABNORMAL LOW (ref 26.0–34.0)
MCHC: 31.5 g/dL (ref 30.0–36.0)
MCV: 79.7 fL (ref 78.0–100.0)
Platelets: 157 10*3/uL (ref 150–400)
RBC: 3.35 MIL/uL — ABNORMAL LOW (ref 3.87–5.11)

## 2011-11-21 MED ORDER — INSULIN PUMP
Freq: Three times a day (TID) | SUBCUTANEOUS | Status: DC
  Administered 2011-11-21 – 2011-11-22 (×3): 0.6 via SUBCUTANEOUS
  Filled 2011-11-21: qty 1

## 2011-11-21 MED ORDER — INSULIN ASPART 100 UNIT/ML ~~LOC~~ SOLN
1.8000 [IU] | Freq: Once | SUBCUTANEOUS | Status: AC
  Administered 2011-11-21: 180 [IU] via SUBCUTANEOUS

## 2011-11-21 NOTE — Progress Notes (Signed)
Patient ID: Charlotte Hendricks, female   DOB: [birth year], 36 y.o.   MRN: [id number] #1 afebrile BP normal HGB acceptable BS slightly low Pt has erythema around an insect bite on her right lower abdomen I will adjust her insulin schedule downward.

## 2011-11-21 NOTE — Plan of Care (Signed)
Problem: Phase II Progression Outcomes Goal: Incision intact & without signs/symptoms of infection Outcome: Completed/Met Date Met:  12-Dec-2011 Drsg removed after shower and incision is CDI except for a very small area in the LLQ. Goal: Other Phase II Outcomes/Goals Outcome: Completed/Met Date Met:  12-Dec-2011 Patient has passed flatus.

## 2011-11-21 NOTE — Progress Notes (Signed)
Spoke with Dr. Foy Guadalajara on phone, updated him of orders Dr. Ambrose Mantle had written this am for insulin pump changes. Dr. Foy Guadalajara agreed with the orders given.

## 2011-11-21 NOTE — Progress Notes (Signed)
Patient has a large raised red area on her mid right quadrant of her abd.This area was marked by prior nurse to see if it expands.Patient stated she thought that it was a bug bite from sitting outside with her kids prior to coming in for delivery.

## 2011-11-21 NOTE — Clinical Social Work Note (Signed)
PSYCHOSOCIAL ASSESSMENT ~ MATERNAL/CHILD Name: Charlotte Hendricks Age:  36 day Referral Date:  11/20/11 Reason/Source:  NICU admission  I. FAMILY/HOME ENVIRONMENT A. Child's Legal Guardian  Parent(s)  Perlie Mayo parent    DSS  Name:  Tanae Petrosky DOB:  [birth year] Age:  36 y/o Address:  [address] [place], [place] [address]  Name:  Leora Platt DOB:  03/04/71 Age:  65 Address:  Same as above  Other Household Members/Support Persons Name:  Deshondra Worst Relationship:  Son DOB:  6 y/o        Name:  Sharyn Creamer                   Relationship:  Dtr                   DOB:  36 y/o        Name:  Emi Holes                   Relationship:  Dtr                   DOB:  36 y/o                   Name:  Blossom Hoops                   Relationship:  Son                   DOB:  36 y/o C.   Other Support - FOB's mother will be helping the family.  II. PSYCHOSOCIAL DATA A. Information Source  Patient Interview X  Family Interview           Other B. Event organiser  Employment  N/A  [employer]     [employer]  X                                  Self Pay   Food Stamps      WIC  Work Scientist, physiological Housing      Section 8     Maternity Care Coordination/Child Service Coordination/Early Intervention    School  Grade      Other Cultural and Environment Information Cultural Issues Impacting Care  III. STRENGTHS  Supportive family/friends Yes   Adequate Resources   Yes Compliance with medical plan  Home prepared for Child (including basic supplies)  Yes Understanding of illness          Yes Other  IV. RISK FACTORS AND CURRENT PROBLEMS V. No Problems Noted VI. Substance Abuse                                           Pt Family             Mental Illness     Pt Family               Family/Relationship Issues   Pt Family      Abuse/Neglect/Domestic Violence   Pt Family   Financial Resources     Pt Family  Transportation     Pt Family  DSS  Involvement  Pt Family  Adjustment to Illness    Pt Family   Knowledge/Cognitive Deficit   Pt Family   Compliance with Treatment   Pt Family   Basic Needs (food, housing, etc)  Pt Family  Housing Concerns    Pt Family  Other             VII. SOCIAL WORK ASSESSMENT SW received referral due to NICU admission.  Pt may be discharged home tomorrow.  Pt expressed understanding of the medical condition of her baby.  The newborn is 1 of 5 children.  Pt stated two of her other children were also in NICU at birth, so she knows what to expect in that environment.  She expressed having a good support system, including family and friends.  She displayed appropriate affect and engaged with family members.  She also has adequate supplies.  Informed her of NICU/SW role and gave her the name of weekday SW if any needs were to come up.  VIII. SOCIAL WORK PLAN (In [place]) No Further Intervention Required/No Barriers to Discharge Psychosocial Support and Ongoing Assessment of Needs Patient/Family  Education Child Protective Services Report  [place]        Date Information/Referral to [facility]   Other

## 2011-11-21 NOTE — Plan of Care (Signed)
Problem: Consults Goal: Diabetes Guidelines if Diabetic/Glucose > 140 If diabetic or lab glucose is > 140 mg/dl - Initiate Diabetes/Hyperglycemia Guidelines & Document Interventions  Outcome: Completed/Met Date Met:  11/21/11 Patient manages her own insulin pump  Problem: Phase I Progression Outcomes Goal: Pain controlled with appropriate interventions Outcome: Completed/Met Date Met:  11/21/11 Good pain relief with po Percocet. Goal: Voiding adequately Outcome: Completed/Met Date Met:  11/21/11 Foley removed,patient tolerated well and has ambulated to bathroom and voided 400cc yellow urine. Goal: Foley catheter patent Outcome: Completed/Met Date Met:  11/21/11 Draining clear yellow urine and then discontinued. Goal: OOB as tolerated unless otherwise ordered Outcome: Completed/Met Date Met:  11/21/11 Has been up and ambulated in the and tolerated very well. Goal: IS, TCDB as ordered Outcome: Completed/Met Date Met:  11/21/11 Patient can get I/S up to 2500 each time,and has a non-productive cough. Goal: Initial discharge plan identified Outcome: Completed/Met Date Met:  11/21/11 Pain control Self care F/U care When to call MD Goal: Other Phase I Outcomes/Goals Outcome: Completed/Met Date Met:  11/21/11 Voided after Foley removal

## 2011-11-22 LAB — GLUCOSE, CAPILLARY
Glucose-Capillary: 185 mg/dL — ABNORMAL HIGH (ref 70–99)
Glucose-Capillary: 167 mg/dL — ABNORMAL HIGH (ref 70–99)

## 2011-11-22 MED ORDER — OXYCODONE-ACETAMINOPHEN 5-325 MG PO TABS: 1.0000 | ORAL_TABLET | Freq: Four times a day (QID) | ORAL | Status: AC | PRN

## 2011-11-22 MED ORDER — IBUPROFEN 800 MG PO TABS: 800.0000 mg | ORAL_TABLET | Freq: Three times a day (TID) | ORAL | Status: AC | PRN

## 2011-11-22 NOTE — Discharge Summary (Signed)
Charlotte Hendricks, TOPPINS NO.:  [id number]  MEDICAL RECORD NO.:  [id number]  LOCATION:  9309                          FACILITY:  [facility]  PHYSICIAN:  Malachi Pro. Ambrose Mantle, M.D. DATE OF BIRTH:  [birth year]  DATE OF ADMISSION:  11/20/2011 DATE OF DISCHARGE:  11/22/2011                              DISCHARGE SUMMARY   This is a 36 year old female, para 1-3-0-4, gravida 5, at 34+ weeks with spontaneous rupture of membranes, admitted for repeat low-transverse cervical C-section.  The patient's prenatal course had been complicated by poor glycemic control.  She had known polyhydramnios.  She received betamethasone and several days prior to admission had refused admission for better controlled.  Blood group and type A positive, negative antibody, rubella immune, RPR nonreactive, hepatitis B surface antigen negative, HIV negative, group B strep was positive in the urine.  GC and Chlamydia were negative.  Cystic fibrosis declined.  First trimester screen was normal.  The patient does have type 1 diabetes.  She had 4 previous deliveries, 3 of which were premature, all of which were large for gestational age.  At the time of admission, the patient had rupture of membranes, and was taken to the operating room by Dr. Ellyn Degraffenreid for C- section.  She underwent a low-transverse cervical C-section by Dr. Ellyn Sebek with delivery of a living female infant at 5:52 a.m.  Apgars were pending due to the neonatologist.  Weight was pending.  Uterus, tubes, and ovaries appeared normal.  The uterus was cleared of all clots, closed in 2 layers.  Abdominal wall was closed.  Skin was closed with a 3-0 Vicryl subcuticular suture.  Postpartum, the patient did very well. Her blood sugars were controlled by initially on having the insulin dosage, but on this dose, her blood sugar on November 21, 2011, at 8:43 a.m. was 68, so on the evening of November 21, 2011, we lowered the basal dose, and since then her blood sugars have  been 111, 139, 185, and 167.  Her initial hemoglobin was 10.3, hematocrit 32.5, white count 9200, platelet count 196,000.  Followup hemoglobin 8.4, hematocrit 26.7, white count 9500, platelet count 157,000.  Postoperative course has been benign, uneventful other than the blood sugars, and on the second postoperative day, she is ready for discharge.  She is ambulating well, tolerating a regular diet, voiding well, and passing flatus.  FINAL DIAGNOSES:  Intrauterine pregnancy at 34+ weeks, delivered vertex by repeat C-section, prior C-section x3, polyhydramnios, type 1 diabetes.  FINAL CONDITION:  Improved.  INSTRUCTIONS:  Include our regular discharge instruction booklet as well as an after visit summary, prescriptions for Motrin 800 mg 1 every 8 hours as needed for pain and Percocet 5/325 thirty tablets 1 every 6 hours as needed for pain.  The patient also was advised to take ferrous sulfate 325 mg twice daily to build her iron.  Also advised to call Dr. Foy Guadalajara for further instructions on her insulin pump.  To return to the office in 2 weeks for followup examination and call with any problems.  OPERATIONS:  Include low-transverse cervical C-section.     Malachi Pro. Ambrose Mantle, M.D.     TFH/MEDQ  D:  11/22/2011  T:  11/22/2011  Job:  [id number]

## 2011-11-22 NOTE — Progress Notes (Signed)
Patient ID: Charlotte Hendricks, female   DOB: 04/03/1976, 36 y.o.   MRN: [id number] #[id number] afebrile BP normal BS's are well controlled She is tolerating a diet passing flatus ambulating well and is ready for d/c.

## 2011-11-22 NOTE — Discharge Instructions (Signed)
booklet °

## 2011-11-22 NOTE — Progress Notes (Signed)
Pt discharged home with husband... Condition stable... No equipment... Taken to car via wheelchair with Roger Shelter., Neurosurgeon.

## 2011-11-23 ENCOUNTER — Encounter (HOSPITAL_COMMUNITY): Payer: Self-pay | Admitting: Obstetrics and Gynecology

## 2011-12-01 NOTE — Progress Notes (Signed)
UR Chart review completed.  

## 2011-12-21 ENCOUNTER — Encounter (HOSPITAL_COMMUNITY): Admission: RE | Payer: Self-pay | Source: Ambulatory Visit

## 2011-12-21 ENCOUNTER — Inpatient Hospital Stay (HOSPITAL_COMMUNITY)
Admission: RE | Admit: 2011-12-21 | Payer: Managed Care, Other (non HMO) | Source: Ambulatory Visit | Admitting: Obstetrics and Gynecology

## 2011-12-21 SURGERY — Surgical Case
Anesthesia: Choice

## 2012-06-20 IMAGING — US US OB FOLLOW-UP
1 series · 12 of 28 positions shown · non-contrast
Comparison: none

[Series 1: us ob follow up · 12 of 36 slices shown]
[im 2/36]
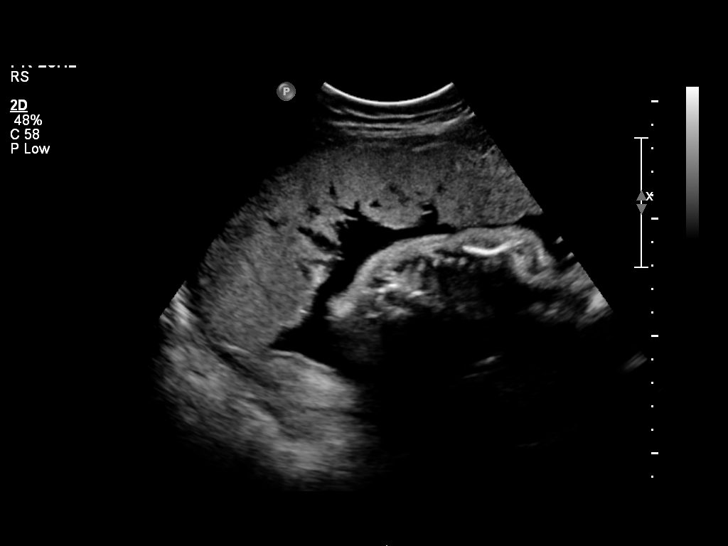
[im 4/36]
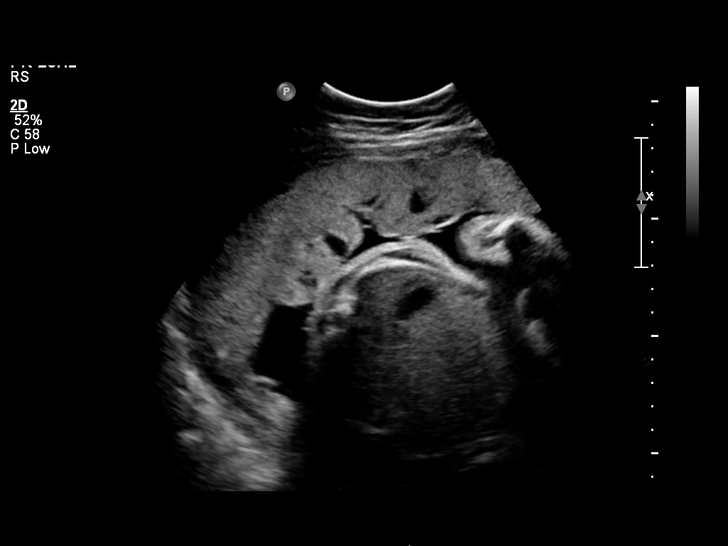
[im 7/36]
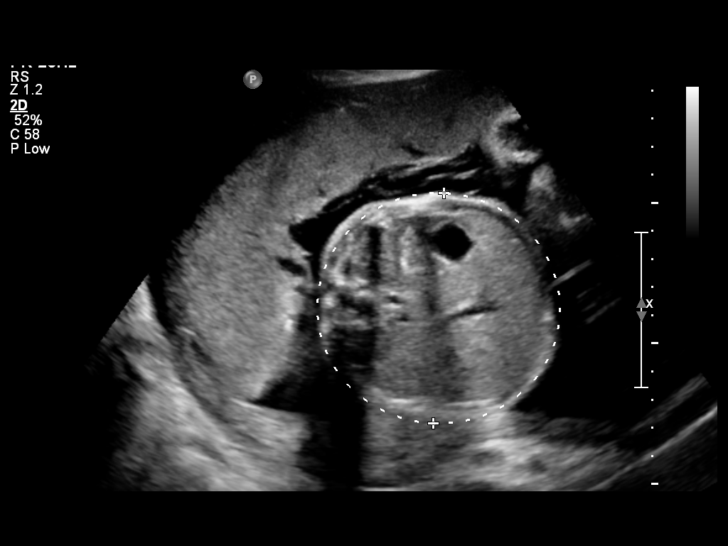
[im 11/36]
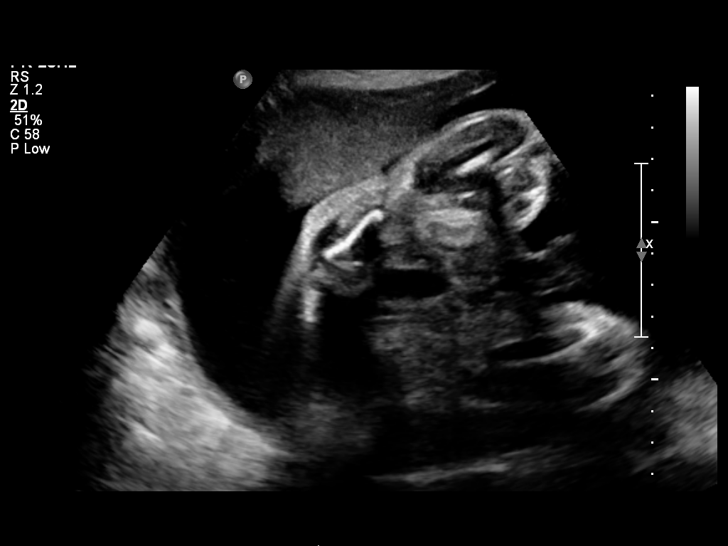
[im 13/36]
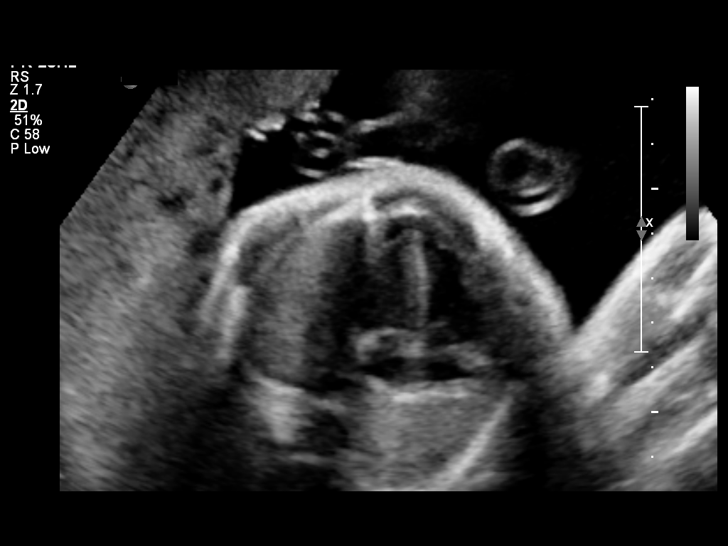
[im 16/36]
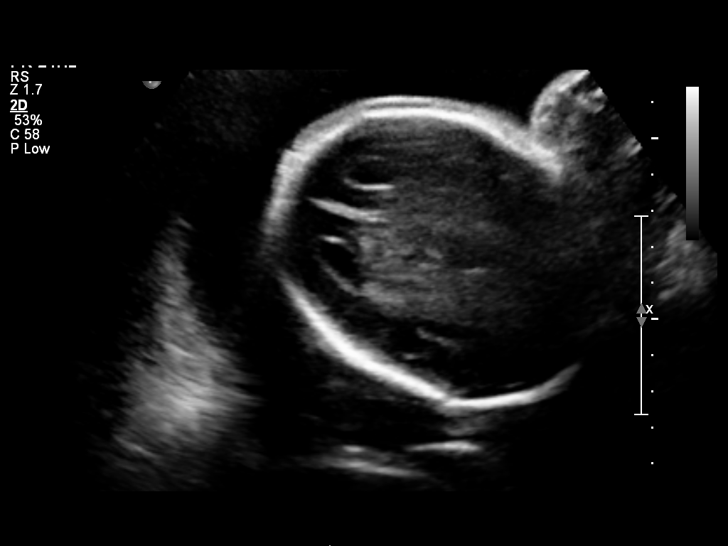
[im 20/36]
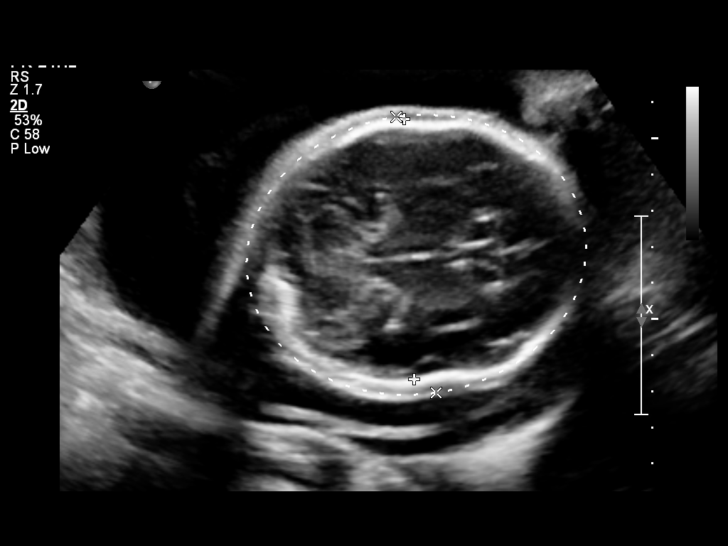
[im 23/36]
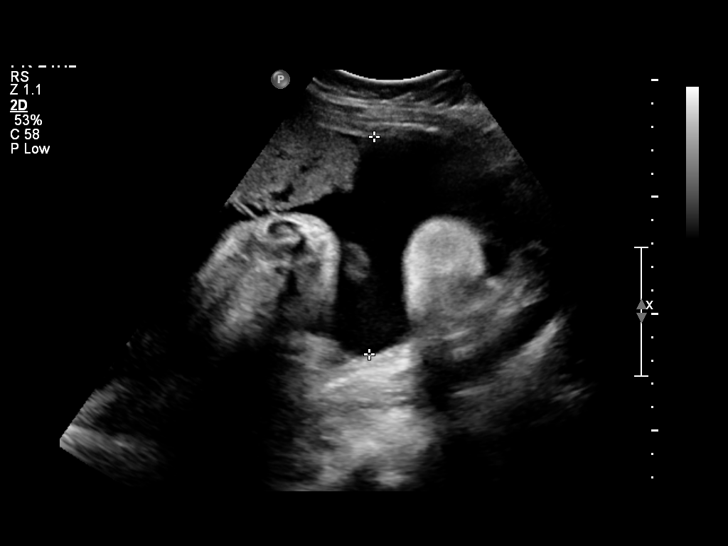
[im 25/36]
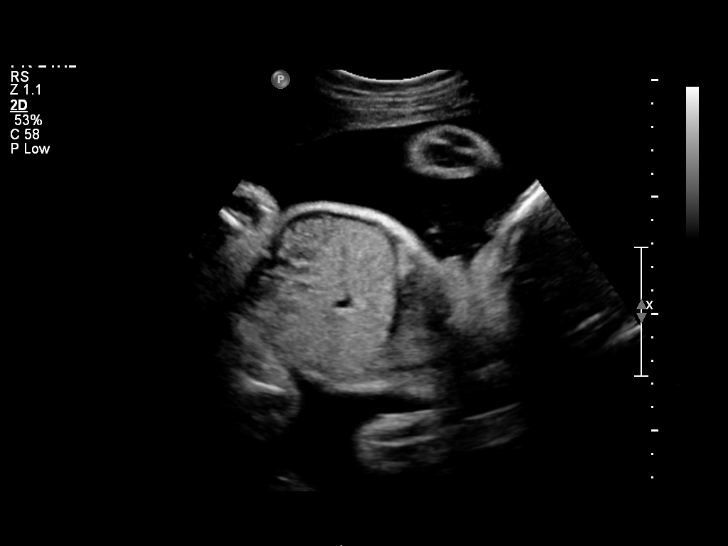
[im 29/36]
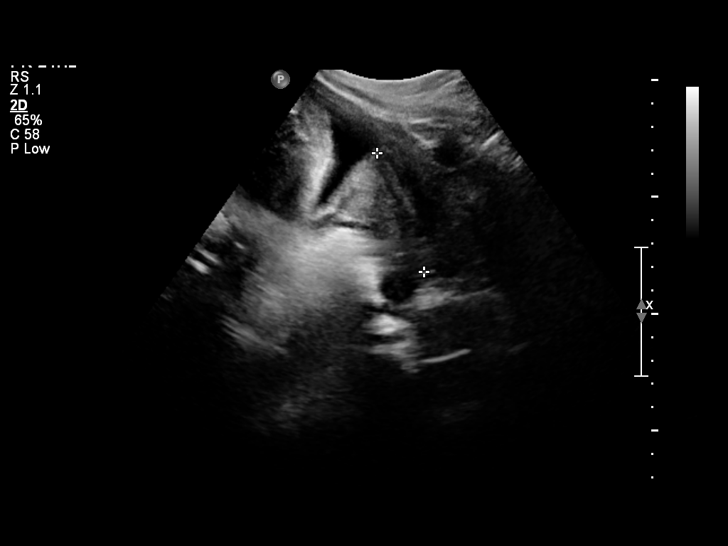
[im 32/36]
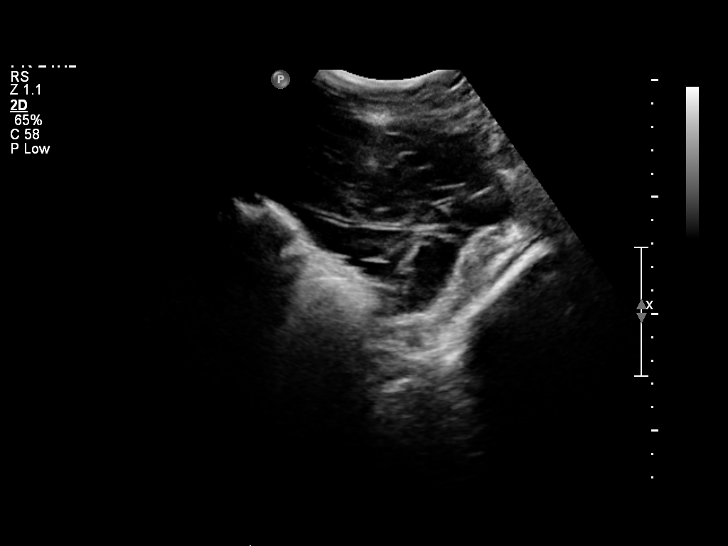
[im 34/36]
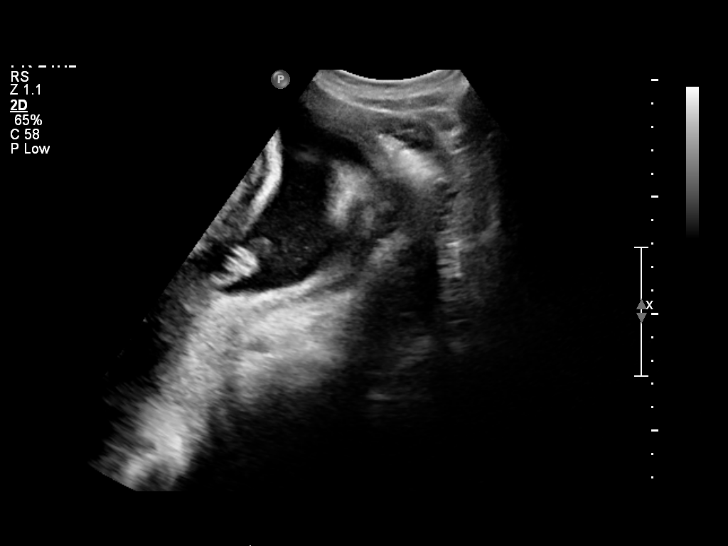

[12 of 28 positions shown; findings below may reference images not displayed]

OBSTETRICS REPORT
                      (Signed Final 09/29/2011 [DATE])

 Order#:         [id number]
Procedures

 US OB FOLLOW UP                                       76816.1
Indications

 Advanced maternal age (AMA), Multigravida
 Diabetes - Pregestational - type I DM on pump
 Poor obstetric history: Previous preterm delivery x 3
 Poor obstetric history: Previous macrosomia
 Previous cesarean section x 3
 Assess Fetal Growth / Estimated Fetal Weight
Fetal Evaluation

 Fetal Heart Rate:  152                         bpm
 Cardiac Activity:  Observed
 Presentation:      Cephalic
 Placenta:          Anterior, above cervical os
 P. Cord            Previously seen
 Insertion:

 Amniotic Fluid
 AFI FV:      Polyhydramnios
 AFI Sum:     26.49   cm     > 97  %Tile     Larg Pckt:   9.27   cm
 RUQ:   6.33   cm    RLQ:    6.06   cm    LUQ:   4.83    cm   LLQ:    9.27   cm
Biometry

 BPD:       72  mm    G. Age:   28w 6d                CI:        71.34   70 - 86
                                                      FL/HC:      19.6   18.6 -

 HC:     271.5  mm    G. Age:   29w 5d       89  %    HC/AC:      1.04   1.05 -

 AC:     261.9  mm    G. Age:   30w 2d     > 97  %    FL/BPD:     74.0   71 - 87
 FL:      53.3  mm    G. Age:   28w 2d       65  %    FL/AC:      20.4   20 - 24

 Est. FW:    1545  gm      3 lb 2 oz     84  %
Gestational Age

 LMP:           27w 2d       Date:   03/22/11                 EDD:   12/27/11
 U/S Today:     29w 2d                                        EDD:   12/13/11
 Best:          27w 2d    Det. By:   LMP  (03/22/11)          EDD:   12/27/11
Anatomy

 Cranium:           Appears normal      Aortic Arch:       Previously seen
 Fetal Cavum:       Appears normal      Ductal Arch:       Previously seen
 Ventricles:        Appears normal      Diaphragm:         Appears normal
 Choroid Plexus:    Previously seen     Stomach:           Appears normal
 Cerebellum:        Previously seen     Abdomen:           Appears normal
 Posterior Fossa:   Previously seen     Abdominal Wall:    Previously seen
 Nuchal Fold:       Previously seen     Cord Vessels:      Previously seen
 Face:              Previously seen     Kidneys:           Appear normal
 Heart:             Appears normal      Bladder:           Previously seen
                    (4 chamber &
                    axis)
 RVOT:              Previously seen     Spine:             Previously seen
 LVOT:              Previously seen     Limbs:             Previously seen

 Other:     Fetus appears to be a male. Heels and 5th digit
            previously seen. Nasal bone previously visualized.
Cervix Uterus Adnexa

 Cervical Length:   5.44      cm

 Cervix:       Normal appearance by transabdominal scan.
 Left Ovary:   Not visualized.
 Right Ovary:  Not visualized.

 Adnexa:     No abnormality visualized.
Impression

 Single living intrauterine gestation in cephalic presentation
 with today's EFW at the 84th percentile.  THe AC is at the
 >97th percentile.  Please continue to follow closely for
 developing LGA.

 Polyhydramnios with AFI = 26.5cm.

 questions or concerns.

## 2012-08-04 IMAGING — US US OB FOLLOW-UP
1 series · 12 of 28 positions shown · non-contrast
Comparison: none

[Series 1: us ob follow-up · 0.29mm/px · 12 of 54 slices shown]
[im 2/54]
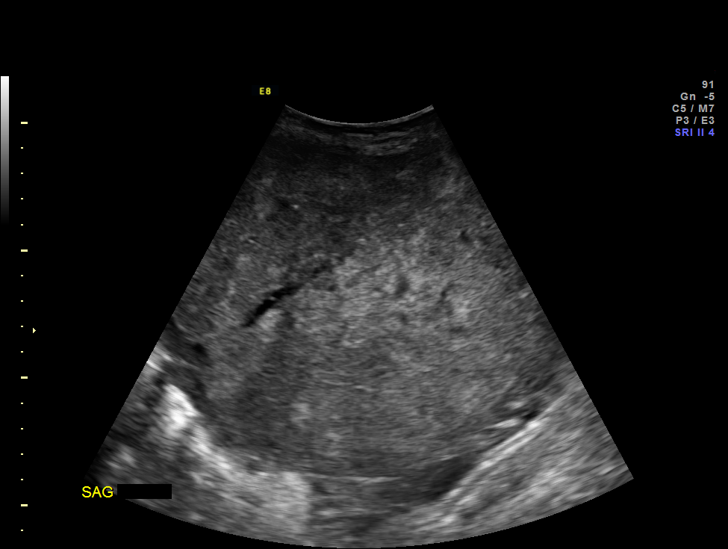
[im 6/54]
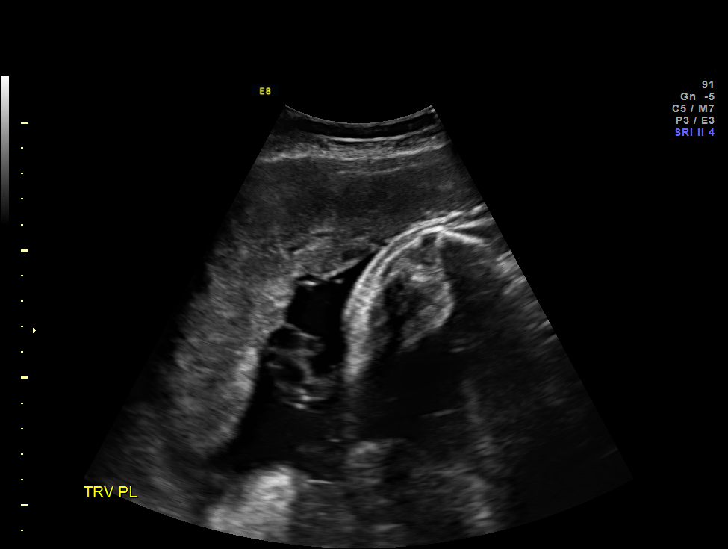
[im 10/54]
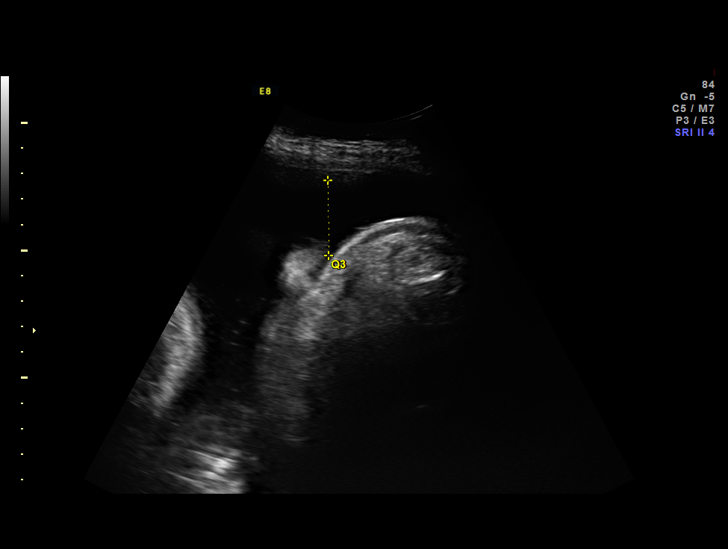
[im 16/54]
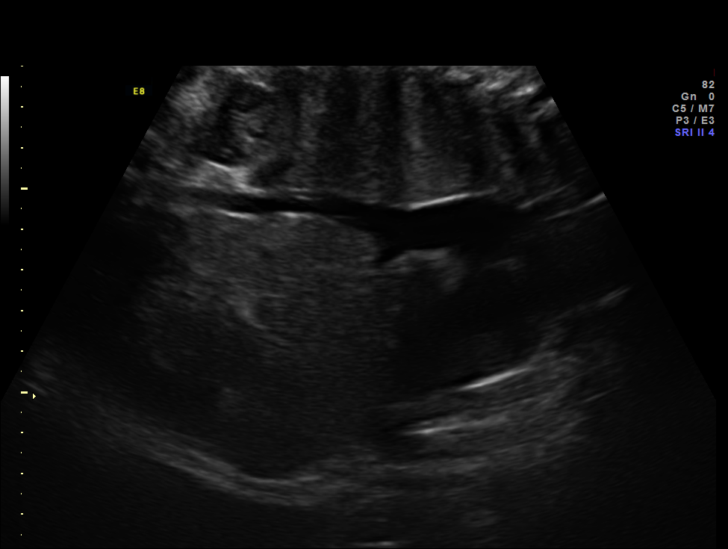
[im 20/54]
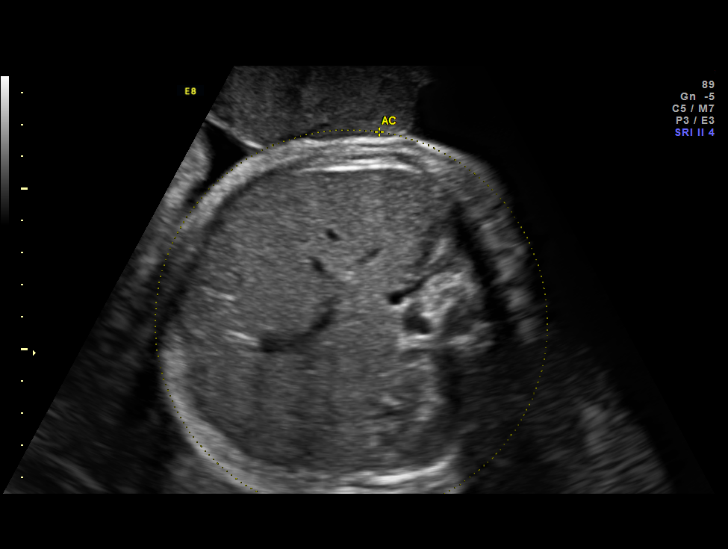
[im 24/54]
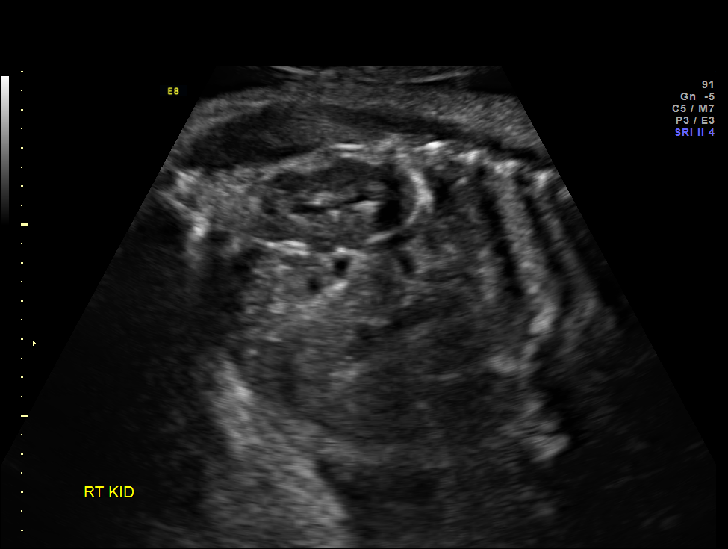
[im 30/54]
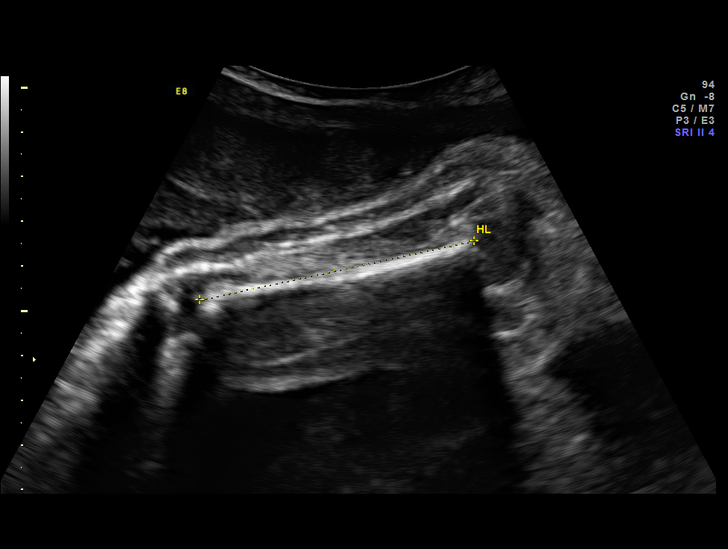
[im 34/54]
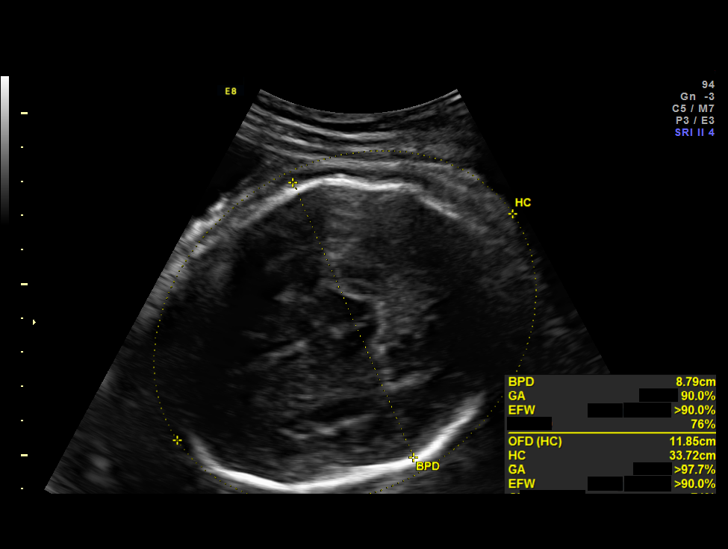
[im 38/54]
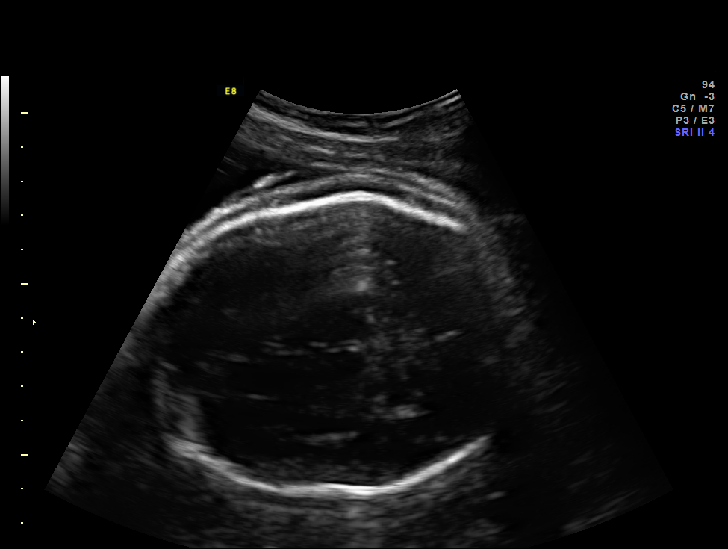
[im 44/54]
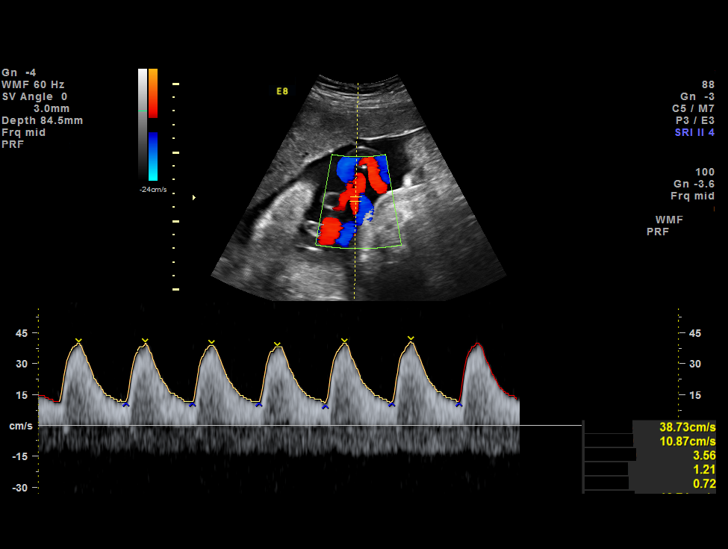
[im 48/54]
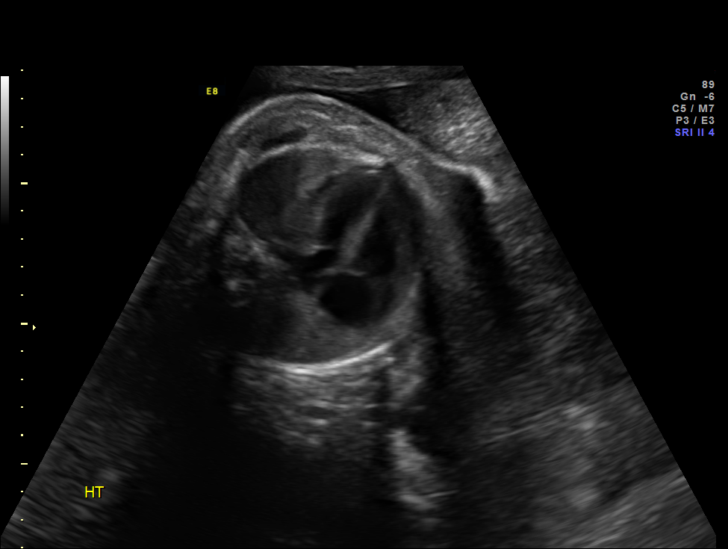
[im 52/54]
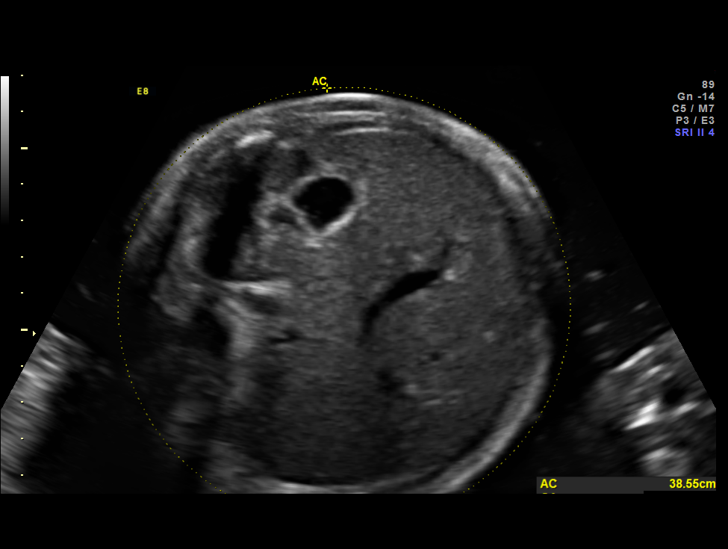

[12 of 28 positions shown; findings below may reference images not displayed]

OBSTETRICS REPORT
                    (Corrected Final 11/16/2011 [DATE])

 Order#:         [id number]
                 53_O
Procedures

 US OB FOLLOW UP                                       76816.1
Indications

 Advanced maternal age (AMA), Multigravida
 Diabetes - Pregestational - type I DM on pump
 Poor obstetric history: Previous preterm delivery x 3
 Poor obstetric history: Previous macrosomia
 Previous cesarean section x 3
 Assess fetal well being
Fetal Evaluation

 Fetal Heart Rate:  150                         bpm
 Cardiac Activity:  Observed
 Presentation:      Cephalic
 Placenta:          Anterior, above cervical os
 P. Cord            Previously Visualized
 Insertion:

 Amniotic Fluid
 AFI FV:      Polyhydramnios
 AFI Sum:     27.77   cm     > 97  %Tile     Larg Pckt:   9.62   cm
 RUQ:   8.01   cm    RLQ:    7.19   cm    LUQ:   2.95    cm   LLQ:    9.62   cm
Biophysical Evaluation

 Amniotic F.V:   Within normal limits       F. Tone:        Observed
 F. Movement:    Observed                   Score:          [DATE]
 F. Breathing:   Observed
Biometry

 BPD:     90.7  mm    G. Age:   36w 5d                CI:         75.7   70 - 86
 OFD:    119.8  mm                                    FL/HC:      20.1   19.4 -

 HC:     337.6  mm    G. Age:   38w 5d     > 97  %    HC/AC:      0.88   0.96 -

 AC:     383.6  mm    G. Age:   N/A        > 97  %    FL/BPD:     75.0   71 - 87
 FL:        68  mm    G. Age:   34w 6d       72  %    FL/AC:      17.7   20 - 24
 HUM:     62.3  mm    G. Age:   36w 1d     > 95  %
 Est. FW:    9545  gm      8 lb 9 oz   > 90  %
Gestational Age

 LMP:           33w 5d       Date:   03/22/11                 EDD:   12/27/11
 U/S Today:     36w 5d                                        EDD:   12/06/11
 Best:          33w 5d    Det. By:   LMP  (03/22/11)          EDD:   12/27/11
Anatomy

 Cranium:           Previously seen     Aortic Arch:       Previously seen
 Fetal Cavum:       Previously seen     Ductal Arch:       Previously seen
 Ventricles:        Previously seen     Diaphragm:         Appears normal
 Choroid Plexus:    Previously seen     Stomach:           Appears
                                                           normal, left
                                                           sided
 Cerebellum:        Previously seen     Abdomen:           Previously seen
 Posterior Fossa:   Previously seen     Abdominal Wall:    Previously seen
 Nuchal Fold:       Previously seen     Cord Vessels:      Previously seen
 Face:              Previously seen     Kidneys:           Appear normal
 Heart:             Previously seen     Bladder:           Appears normal
 RVOT:              Previously seen     Spine:             Previously seen
 LVOT:              Previously seen     Limbs:             Previously seen

 Other:     Male gender.  Heels and 5th digit previously seen.
Cervix Uterus Adnexa

 Cervix:       Not visualized (advanced GA >29 wks)
Comments

 Patient with type I DM on insulin pump, poor glycemic control.
Impression

 Single IUP at 33 [DATE] weeks
 Polyhydramnios with an AFI of 27.7 cm
 Suspected fetal macrosomia with an EFW of 9545 g (>90th
 %)
 BPP of [DATE]
Recommendations

 Recommend inpatient observation to achieve improved
 diabetic control.  Discussed recommendations with Dr.
 Fernando Ribeiro - plan admission this weekend with the assistance of
 the diabetic team.
 Continue 2x weekly antepartum fetal testing.

 questions or concerns.
                 Attending Physician, LORRAINE

## 2013-09-08 ENCOUNTER — Ambulatory Visit: Payer: Managed Care, Other (non HMO) | Admitting: *Deleted

## 2013-09-13 ENCOUNTER — Ambulatory Visit: Payer: Managed Care, Other (non HMO) | Admitting: *Deleted

## 2014-04-02 ENCOUNTER — Encounter (HOSPITAL_COMMUNITY): Payer: Self-pay | Admitting: Obstetrics and Gynecology

## 2014-07-19 ENCOUNTER — Other Ambulatory Visit: Payer: Self-pay | Admitting: Obstetrics & Gynecology

## 2014-07-19 ENCOUNTER — Other Ambulatory Visit (HOSPITAL_COMMUNITY)
Admission: RE | Admit: 2014-07-19 | Discharge: 2014-07-19 | Disposition: A | Discharge status: Home / Self Care | Payer: Managed Care, Other (non HMO) | Source: Ambulatory Visit | Attending: Obstetrics & Gynecology | Admitting: Obstetrics & Gynecology

## 2014-07-19 DIAGNOSIS — Z1151 Encounter for screening for human papillomavirus (HPV): Secondary | ICD-10-CM | POA: Insufficient documentation

## 2014-07-19 DIAGNOSIS — Z01419 Encounter for gynecological examination (general) (routine) without abnormal findings: Secondary | ICD-10-CM | POA: Insufficient documentation

## 2014-07-20 LAB — CYTOLOGY - PAP

## 2014-09-06 ENCOUNTER — Other Ambulatory Visit: Payer: Self-pay | Admitting: Orthopedic Surgery

## 2014-09-27 ENCOUNTER — Encounter (HOSPITAL_BASED_OUTPATIENT_CLINIC_OR_DEPARTMENT_OTHER): Payer: Self-pay | Admitting: *Deleted

## 2014-09-27 NOTE — Progress Notes (Signed)
Coming Monday or Tuesday for BMET and EKG.

## 2014-10-02 ENCOUNTER — Other Ambulatory Visit: Payer: Self-pay

## 2014-10-02 ENCOUNTER — Encounter (HOSPITAL_BASED_OUTPATIENT_CLINIC_OR_DEPARTMENT_OTHER)
Admission: RE | Admit: 2014-10-02 | Discharge: 2014-10-02 | Disposition: A | Discharge status: Home / Self Care | Payer: Managed Care, Other (non HMO) | Source: Ambulatory Visit | Attending: Orthopedic Surgery | Admitting: Orthopedic Surgery

## 2014-10-02 DIAGNOSIS — Z794 Long term (current) use of insulin: Secondary | ICD-10-CM | POA: Diagnosis not present

## 2014-10-02 DIAGNOSIS — M65341 Trigger finger, right ring finger: Secondary | ICD-10-CM | POA: Diagnosis not present

## 2014-10-02 DIAGNOSIS — M65332 Trigger finger, left middle finger: Secondary | ICD-10-CM | POA: Diagnosis not present

## 2014-10-02 DIAGNOSIS — E109 Type 1 diabetes mellitus without complications: Secondary | ICD-10-CM | POA: Diagnosis not present

## 2014-10-02 DIAGNOSIS — G5602 Carpal tunnel syndrome, left upper limb: Secondary | ICD-10-CM | POA: Diagnosis not present

## 2014-10-02 DIAGNOSIS — G5601 Carpal tunnel syndrome, right upper limb: Secondary | ICD-10-CM | POA: Diagnosis not present

## 2014-10-02 DIAGNOSIS — M65331 Trigger finger, right middle finger: Secondary | ICD-10-CM | POA: Diagnosis present

## 2014-10-02 LAB — BASIC METABOLIC PANEL
ANION GAP: 7 (ref 5–15)
BUN: 13 mg/dL (ref 6–20)
CALCIUM: 9 mg/dL (ref 8.9–10.3)
CO2: 30 mmol/L (ref 22–32)
CREATININE: 0.68 mg/dL (ref 0.44–1.00)
Chloride: 101 mmol/L (ref 101–111)
Glucose, Bld: 173 mg/dL — ABNORMAL HIGH (ref 70–99)
Potassium: 4.8 mmol/L (ref 3.5–5.1)
Sodium: 138 mmol/L (ref 135–145)

## 2014-10-04 ENCOUNTER — Ambulatory Visit (HOSPITAL_BASED_OUTPATIENT_CLINIC_OR_DEPARTMENT_OTHER): Payer: Managed Care, Other (non HMO) | Admitting: Certified Registered"

## 2014-10-04 ENCOUNTER — Encounter (HOSPITAL_BASED_OUTPATIENT_CLINIC_OR_DEPARTMENT_OTHER): Payer: Self-pay | Admitting: Orthopedic Surgery

## 2014-10-04 ENCOUNTER — Ambulatory Visit (HOSPITAL_BASED_OUTPATIENT_CLINIC_OR_DEPARTMENT_OTHER)
Admission: RE | Admit: 2014-10-04 | Discharge: 2014-10-04 | Disposition: A | Discharge status: Home / Self Care | Payer: Managed Care, Other (non HMO) | Source: Ambulatory Visit | Attending: Orthopedic Surgery | Admitting: Orthopedic Surgery

## 2014-10-04 ENCOUNTER — Encounter (HOSPITAL_BASED_OUTPATIENT_CLINIC_OR_DEPARTMENT_OTHER)
Admission: RE | Disposition: A | Discharge status: Home / Self Care | Payer: Self-pay | Source: Ambulatory Visit | Attending: Orthopedic Surgery

## 2014-10-04 DIAGNOSIS — M65341 Trigger finger, right ring finger: Secondary | ICD-10-CM | POA: Insufficient documentation

## 2014-10-04 DIAGNOSIS — Z794 Long term (current) use of insulin: Secondary | ICD-10-CM | POA: Insufficient documentation

## 2014-10-04 DIAGNOSIS — E109 Type 1 diabetes mellitus without complications: Secondary | ICD-10-CM | POA: Insufficient documentation

## 2014-10-04 DIAGNOSIS — G5602 Carpal tunnel syndrome, left upper limb: Secondary | ICD-10-CM | POA: Insufficient documentation

## 2014-10-04 DIAGNOSIS — M65331 Trigger finger, right middle finger: Secondary | ICD-10-CM | POA: Diagnosis not present

## 2014-10-04 DIAGNOSIS — G5601 Carpal tunnel syndrome, right upper limb: Secondary | ICD-10-CM | POA: Insufficient documentation

## 2014-10-04 DIAGNOSIS — M65332 Trigger finger, left middle finger: Secondary | ICD-10-CM | POA: Insufficient documentation

## 2014-10-04 HISTORY — PX: TRIGGER FINGER RELEASE: SHX641

## 2014-10-04 HISTORY — PX: CARPAL TUNNEL RELEASE: SHX101

## 2014-10-04 LAB — GLUCOSE, CAPILLARY
Glucose-Capillary: 119 mg/dL — ABNORMAL HIGH (ref 70–99)
Glucose-Capillary: 140 mg/dL — ABNORMAL HIGH (ref 70–99)

## 2014-10-04 LAB — POCT HEMOGLOBIN-HEMACUE: Hemoglobin: 13.6 g/dL (ref 12.0–15.0)

## 2014-10-04 SURGERY — CARPAL TUNNEL RELEASE
Anesthesia: Monitor Anesthesia Care | Site: Wrist | Laterality: Right

## 2014-10-04 MED ORDER — MIDAZOLAM HCL 2 MG/2ML IJ SOLN
1.0000 mg | INTRAMUSCULAR | Status: DC | PRN
  Administered 2014-10-04: 2 mg via INTRAVENOUS

## 2014-10-04 MED ORDER — BUPIVACAINE HCL (PF) 0.25 % IJ SOLN
INTRAMUSCULAR | Status: AC
  Filled 2014-10-04: qty 30

## 2014-10-04 MED ORDER — MIDAZOLAM HCL 2 MG/2ML IJ SOLN
INTRAMUSCULAR | Status: AC
  Filled 2014-10-04: qty 2

## 2014-10-04 MED ORDER — PROPOFOL INFUSION 10 MG/ML OPTIME
INTRAVENOUS | Status: DC | PRN
  Administered 2014-10-04: 50 ug/kg/min via INTRAVENOUS

## 2014-10-04 MED ORDER — VANCOMYCIN HCL IN DEXTROSE 1-5 GM/200ML-% IV SOLN
INTRAVENOUS | Status: AC
  Filled 2014-10-04: qty 200

## 2014-10-04 MED ORDER — VANCOMYCIN HCL IN DEXTROSE 1-5 GM/200ML-% IV SOLN
1000.0000 mg | INTRAVENOUS | Status: AC
  Administered 2014-10-04: 1000 mg via INTRAVENOUS

## 2014-10-04 MED ORDER — FENTANYL CITRATE (PF) 100 MCG/2ML IJ SOLN: 50.0000 ug | INTRAMUSCULAR | Status: DC | PRN

## 2014-10-04 MED ORDER — GLYCOPYRROLATE 0.2 MG/ML IJ SOLN: 0.2000 mg | Freq: Once | INTRAMUSCULAR | Status: DC | PRN

## 2014-10-04 MED ORDER — ACETAMINOPHEN 325 MG PO TABS: 325.0000 mg | ORAL_TABLET | ORAL | Status: DC | PRN

## 2014-10-04 MED ORDER — PROPOFOL 10 MG/ML IV BOLUS
INTRAVENOUS | Status: DC | PRN
  Administered 2014-10-04: 50 mg via INTRAVENOUS

## 2014-10-04 MED ORDER — ONDANSETRON HCL 4 MG/2ML IJ SOLN
INTRAMUSCULAR | Status: DC | PRN
  Administered 2014-10-04: 4 mg via INTRAVENOUS

## 2014-10-04 MED ORDER — OXYCODONE HCL 5 MG PO TABS: 5.0000 mg | ORAL_TABLET | Freq: Once | ORAL | Status: DC | PRN

## 2014-10-04 MED ORDER — HYDROCODONE-ACETAMINOPHEN 5-325 MG PO TABS: 1.0000 | ORAL_TABLET | Freq: Four times a day (QID) | ORAL | Status: DC | PRN

## 2014-10-04 MED ORDER — CHLORHEXIDINE GLUCONATE 4 % EX LIQD: 60.0000 mL | Freq: Once | CUTANEOUS | Status: DC

## 2014-10-04 MED ORDER — LIDOCAINE HCL (PF) 0.5 % IJ SOLN
INTRAMUSCULAR | Status: DC | PRN
  Administered 2014-10-04: 30 mL via INTRAVENOUS

## 2014-10-04 MED ORDER — FENTANYL CITRATE (PF) 100 MCG/2ML IJ SOLN: 25.0000 ug | INTRAMUSCULAR | Status: DC | PRN

## 2014-10-04 MED ORDER — PROPOFOL 500 MG/50ML IV EMUL
INTRAVENOUS | Status: AC
  Filled 2014-10-04: qty 50

## 2014-10-04 MED ORDER — LACTATED RINGERS IV SOLN
INTRAVENOUS | Status: DC
  Administered 2014-10-04: 08:00:00 via INTRAVENOUS

## 2014-10-04 MED ORDER — FENTANYL CITRATE (PF) 100 MCG/2ML IJ SOLN
INTRAMUSCULAR | Status: DC | PRN
  Administered 2014-10-04: 100 ug via INTRAVENOUS

## 2014-10-04 MED ORDER — BUPIVACAINE HCL (PF) 0.25 % IJ SOLN
INTRAMUSCULAR | Status: DC | PRN
  Administered 2014-10-04: 10 mL

## 2014-10-04 MED ORDER — OXYCODONE HCL 5 MG/5ML PO SOLN: 5.0000 mg | Freq: Once | ORAL | Status: DC | PRN

## 2014-10-04 MED ORDER — ACETAMINOPHEN 160 MG/5ML PO SOLN
325.0000 mg | ORAL | Status: DC | PRN
Start: 2014-10-04 — End: 2014-10-04

## 2014-10-04 MED ORDER — FENTANYL CITRATE (PF) 100 MCG/2ML IJ SOLN
INTRAMUSCULAR | Status: AC
  Filled 2014-10-04: qty 2

## 2014-10-04 SURGICAL SUPPLY — 37 items
BANDAGE COBAN STERILE 2 (GAUZE/BANDAGES/DRESSINGS) ×4 IMPLANT
BLADE SURG 15 STRL LF DISP TIS (BLADE) ×2 IMPLANT
BLADE SURG 15 STRL SS (BLADE) ×2
BNDG COHESIVE 3X5 TAN STRL LF (GAUZE/BANDAGES/DRESSINGS) ×4 IMPLANT
BNDG ESMARK 4X9 LF (GAUZE/BANDAGES/DRESSINGS) IMPLANT
BNDG GAUZE ELAST 4 BULKY (GAUZE/BANDAGES/DRESSINGS) ×4 IMPLANT
CHLORAPREP W/TINT 26ML (MISCELLANEOUS) ×4 IMPLANT
CORDS BIPOLAR (ELECTRODE) ×4 IMPLANT
COVER BACK TABLE 60X90IN (DRAPES) ×4 IMPLANT
COVER MAYO STAND STRL (DRAPES) ×4 IMPLANT
CUFF TOURNIQUET SINGLE 18IN (TOURNIQUET CUFF) ×4 IMPLANT
DECANTER SPIKE VIAL GLASS SM (MISCELLANEOUS) IMPLANT
DRAPE EXTREMITY T 121X128X90 (DRAPE) ×4 IMPLANT
DRAPE SURG 17X23 STRL (DRAPES) ×4 IMPLANT
DRSG PAD ABDOMINAL 8X10 ST (GAUZE/BANDAGES/DRESSINGS) ×4 IMPLANT
GAUZE SPONGE 4X4 12PLY STRL (GAUZE/BANDAGES/DRESSINGS) ×4 IMPLANT
GAUZE XEROFORM 1X8 LF (GAUZE/BANDAGES/DRESSINGS) ×4 IMPLANT
GLOVE BIO SURGEON STRL SZ 6.5 (GLOVE) ×6 IMPLANT
GLOVE BIO SURGEONS STRL SZ 6.5 (GLOVE) ×2
GLOVE BIOGEL PI IND STRL 7.0 (GLOVE) ×4 IMPLANT
GLOVE BIOGEL PI IND STRL 8.5 (GLOVE) ×2 IMPLANT
GLOVE BIOGEL PI INDICATOR 7.0 (GLOVE) ×4
GLOVE BIOGEL PI INDICATOR 8.5 (GLOVE) ×2
GLOVE SURG ORTHO 8.0 STRL STRW (GLOVE) ×4 IMPLANT
GOWN STRL REUS W/ TWL LRG LVL3 (GOWN DISPOSABLE) ×4 IMPLANT
GOWN STRL REUS W/TWL LRG LVL3 (GOWN DISPOSABLE) ×4
GOWN STRL REUS W/TWL XL LVL3 (GOWN DISPOSABLE) ×4 IMPLANT
NEEDLE PRECISIONGLIDE 27X1.5 (NEEDLE) ×4 IMPLANT
NS IRRIG 1000ML POUR BTL (IV SOLUTION) ×4 IMPLANT
PACK BASIN DAY SURGERY FS (CUSTOM PROCEDURE TRAY) ×4 IMPLANT
STOCKINETTE 4X48 STRL (DRAPES) ×4 IMPLANT
SUT ETHILON 4 0 PS 2 18 (SUTURE) ×4 IMPLANT
SUT VICRYL 4-0 PS2 18IN ABS (SUTURE) IMPLANT
SYR BULB 3OZ (MISCELLANEOUS) ×4 IMPLANT
SYR CONTROL 10ML LL (SYRINGE) ×4 IMPLANT
TOWEL OR 17X24 6PK STRL BLUE (TOWEL DISPOSABLE) ×4 IMPLANT
UNDERPAD 30X30 (UNDERPADS AND DIAPERS) ×4 IMPLANT

## 2014-10-04 NOTE — Transfer of Care (Signed)
Immediate Anesthesia Transfer of Care Note  Patient: Charlotte Hendricks  Procedure(s) Performed: Procedure(s): RIGHT CARPAL TUNNEL RELEASE (Right) RELEASE A-1 PULLEY RIGHT MIDDLE AND RIGHT RING FINGER (Right)  Patient Location: PACU  Anesthesia Type:MAC  Level of Consciousness: awake, alert  and oriented  Airway & Oxygen Therapy: Patient Spontanous Breathing  Post-op Assessment: Report given to RN and Post -op Vital signs reviewed and stable  Post vital signs: Reviewed and stable  Last Vitals:  Filed Vitals:   10/04/14 0729  BP: 113/66  Pulse: 72  Temp: 36.8 C  Resp: 20    Complications: No apparent anesthesia complications

## 2014-10-04 NOTE — Anesthesia Preprocedure Evaluation (Addendum)
Anesthesia Evaluation  Patient identified by MRN, date of birth, ID band Patient awake    Reviewed: Allergy & Precautions, NPO status , Patient's Chart, lab work & pertinent test results  History of Anesthesia Complications Negative for: history of anesthetic complications  Airway Mallampati: I  TM Distance: >3 FB Neck ROM: Full    Dental  (+) Teeth Intact   Pulmonary neg pulmonary ROS,  breath sounds clear to auscultation        Cardiovascular negative cardio ROS  Rhythm:Regular     Neuro/Psych negative neurological ROS  negative psych ROS   GI/Hepatic negative GI ROS, Neg liver ROS,   Endo/Other  diabetes, Type 1, Insulin Dependent  Renal/GU      Musculoskeletal   Abdominal   Peds  Hematology negative hematology ROS (+)   Anesthesia Other Findings   Reproductive/Obstetrics                            Anesthesia Physical Anesthesia Plan  ASA: II  Anesthesia Plan: MAC and Bier Block   Post-op Pain Management:    Induction: Intravenous  Airway Management Planned: Natural Airway  Additional Equipment: None  Intra-op Plan:   Post-operative Plan:   Informed Consent: I have reviewed the patients History and Physical, chart, labs and discussed the procedure including the risks, benefits and alternatives for the proposed anesthesia with the patient or authorized representative who has indicated his/her understanding and acceptance.   Dental advisory given  Plan Discussed with: CRNA and Surgeon  Anesthesia Plan Comments:         Anesthesia Quick Evaluation

## 2014-10-04 NOTE — Brief Op Note (Signed)
10/04/2014  9:24 AM  PATIENT:  Alonna MiniumHeather Luth  39 y.o. female  PRE-OPERATIVE DIAGNOSIS:  right carpal tunnel syndrome STS Right Middle Right Ring  G56.01, M65.331, M65.371  POST-OPERATIVE DIAGNOSIS:  right carpal tunnel syndrome trigger finger right middle and ring  PROCEDURE:  Procedure(s): RIGHT CARPAL TUNNEL RELEASE (Right) RELEASE A-1 PULLEY RIGHT MIDDLE AND RIGHT RING FINGER (Right)  SURGEON:  Surgeon(s) and Role:    * Cindee SaltGary Kimmora Risenhoover, MD - Primary  PHYSICIAN ASSISTANT:   ASSISTANTS: none   ANESTHESIA:   local and regional  EBL:  Total I/O In: 700 [I.V.:700] Out: -   BLOOD ADMINISTERED:none  DRAINS: none   LOCAL MEDICATIONS USED:  BUPIVICAINE   SPECIMEN:  No Specimen  DISPOSITION OF SPECIMEN:  N/A  COUNTS:  YES  TOURNIQUET:   Total Tourniquet Time Documented: Forearm (Right) - 29 minutes Total: Forearm (Right) - 29 minutes   DICTATION: .Other Dictation: Dictation Number [id number]  PLAN OF CARE: Discharge to home after PACU  PATIENT DISPOSITION:  PACU - hemodynamically stable.

## 2014-10-04 NOTE — H&P (Signed)
Charlotte Hendricks is a 38-year old right hand dominant female referred by Dr. Robert Fried for a consultation with respect to triggering of her right middle and ring and left middle finger. She has had this for at least 3 years. She recalls no history of carpal tunnel syndrome. She has had the right side middle finger injected 3 times maybe 4. The ring finger has been injected once. The hand has not been injected. She has not had nerve conductions done. She has had numbness and tingling for 10 years. She states her right side is relatively constant at this time to the median nerve distribution. She recalls no history of injury to the hand or neck. She does not relate waking at night with these. She has not had nerve conductions done.  She has a history of diabetes. She was on thyroid replacement but is off that now. There is no history of arthritis or gout. She has had her nerve conductions done and these reveal carpal tunnel syndrome bilaterally with motor of 5.9/left and 9.1/right; sensory delay of 3.1/left and 3.7/right.  Amplitude diminution to 14/left and 9/right.  She continues to have triggering of her middle and ring fingers on her right side.    PAST MEDICAL HISTORY:  She is allergic to Keflex, Griseofulvin and Lidocaine. She is on insulin. She has had wisdom teeth removal, breast augmentation, breast biopsy, C-section.  FAMILY MEDICAL HISTORY: Positive for heart disease and high BP.  SOCIAL HISTORY:  She does not smoke or use alcohol. 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  REVIEW OF SYSTEMS: Positive for contacts, lumps left side. Charlotte Hendricks is an 38 y.o. female.   Chief Complaint: CTS right and STS right middle and ring HPI: see above  Past Medical History  Diagnosis Date  . Diabetes mellitus     Type I  . Preterm premature rupture of membranes (PPROM) delivered, current hospitalization 11/20/2011  . DM type 1 (diabetes mellitus, type 1) 11/20/2011  . Polyhydramnios affecting fetus or newborn  11/20/2011  . S/P cesarean section 11/20/2011    Past Surgical History  Procedure Laterality Date  . Cesarean section    . Wisdom tooth extraction    . Cesarean section  11/20/2011    Procedure: CESAREAN SECTION;  Surgeon: Jody Bovard, MD;  Location: [facility];  Service: Gynecology;  Laterality: N/A;  . Breast biopsy      2008    Family History  Problem Relation Age of Onset  . Hyperlipidemia Father    Social History:  reports that she has never smoked. She does not have any smokeless tobacco history on file. She reports that she drinks alcohol. She reports that she does not use illicit drugs.  Allergies:  Allergies  Allergen Reactions  . Cephalexin Hives and Itching  . Griseofulvin Hives and Itching  . Lidocaine Other (See Comments)    Light headedness    No prescriptions prior to admission    Results for orders placed or performed during the hospital encounter of 10/04/14 (from the past 48 hour(s))  Basic metabolic panel     Status: Abnormal   Collection Time: 10/02/14  9:45 AM  Result Value Ref Range   Sodium 138 135 - 145 mmol/L   Potassium 4.8 3.5 - 5.1 mmol/L   Chloride 101 101 - 111 mmol/L   CO2 30 22 - 32 mmol/L   Glucose, Bld 173 (H) 70 - 99 mg/dL   BUN 13 6 - 20 mg/dL   Creatinine, Ser   0.68 0.44 - 1.00 mg/dL   Calcium 9.0 8.9 - 10.3 mg/dL   GFR calc non Af Amer >60 >60 mL/min   GFR calc Af Amer >60 >60 mL/min    Comment: (NOTE) The eGFR has been calculated using the CKD EPI equation. This calculation has not been validated in all clinical situations. eGFR's persistently <90 mL/min signify possible Chronic Kidney Disease.    Anion gap 7 5 - 15    No results found.   Pertinent items are noted in HPI.  Height 5' 6" (1.676 m), weight 63.504 kg (140 lb), last menstrual period 09/24/2014, not currently breastfeeding.  General appearance: alert, cooperative and appears stated age Head: Normocephalic, without obvious abnormality Neck: no JVD Resp: clear to  auscultation bilaterally Cardio: regular rate and rhythm, S1, S2 normal, no murmur, click, rub or gallop GI: soft, non-tender; bowel sounds normal; no masses,  no organomegaly Extremities: trigger ring and middle and numbness hand Pulses: 2+ and symmetric Skin: Skin color, texture, turgor normal. No rashes or lesions Neurologic: Grossly normal Incision/Wound: na  Assessment/Plan X-rays of her hands are negative.  DIAGNOSIS: (1)  bilateral carpal tunnel syndrome. (2) STS right middle, right ring and left middle finger. PLAN: We would like to proceed with surgical intervention, at her request.  We have discussed surgical intervention, the pre, peri and postoperative course were discussed along with the risks and complications.  The patient is aware there is no guarantee with the surgery, possibility of infection, recurrence, injury to arteries, nerves, tendons, incomplete relief of symptoms and dystrophy.   She is scheduled for carpal tunnel release, right hand, with release A-1 pulley right middle, right ring finger.   Daeron Carreno R 10/04/2014, 6:21 AM

## 2014-10-04 NOTE — Op Note (Signed)
NAMZadie Hendricks:  Charlotte Hendricks, Charlotte Hendricks                ACCOUNT NO.:  [id number]  MEDICAL RECORD NO.:  [id number]  LOCATION:                                 FACILITY:  PHYSICIAN:  Cindee SaltGary Krishawna Stiefel, M.D.       DATE OF BIRTH:  11/09/1975  DATE OF PROCEDURE:  10/04/2014 DATE OF DISCHARGE:                              OPERATIVE REPORT   PREOPERATIVE DIAGNOSIS:  Stenosing tenosynovitis, right middle, right ring fingers; with carpal tunnel syndrome, right hand.  POSTOPERATIVE DIAGNOSIS:  Stenosing tenosynovitis, right middle, right ring fingers; with carpal tunnel syndrome, right hand.  OPERATION:  Decompression, right median nerve with release of A1 pulley, right middle, right ring fingers.  SURGEON:  Cindee SaltGary Jennife Zaucha, MD  ANESTHESIA:  Forearm-based IV regional with local infiltration.  ANESTHESIOLOGIST:  Moser.  HISTORY:  The patient is a 39 year old female with a history of trigger in middle and ring fingers of her right hand along with carpal tunnel syndrome.  Nerve conduction is positive, nonresponsive to conservative treatment.  She has elected to undergo surgical release of each of the 2 digits along with release of median nerve.  Pre, peri, postoperative course have been discussed along with risks and complications.  She is aware that there is no guarantee with the surgery; possibility of infection; recurrence of injury to arteries, nerves, tendons; incomplete relief of symptoms; dystrophy.  In preoperative area, the patient was seen, the extremity marked by both patient and surgeon.  Antibiotic given.  PROCEDURE IN DETAIL:  The patient was brought to the operating room, where forearm-based IV regional anesthetic was carried out without difficulty.  She was prepped using ChloraPrep, supine position with the right arm free.  A 3-minute dry time was allowed.  Time-out taken, confirming the patient and procedure.  The fingers were attended to first.  An oblique incision was made over the A1 pulley.  The  middle finger, right hand carried down through subcutaneous tissue.  Bleeders were electrocauterized with bipolar.  Neurovascular structures protected.  The A1 pulley was released on its radial aspect.  A small incision made centrally in A2.  Partial tenosynovectomy performed proximally.  Two tendons separated.  No further triggering was noted with passive flexion and extension.  A separate incision was then made over the ring finger A1 pulley, carried down through subcutaneous tissue.  Bleeders again electrocauterized.  Retractors placed protecting neurovascular bundles radially and ulnarly.  The A1 pulley was released on its radial aspect.  Small flexor sheath was present,  this was removed.  The A2 pulley was incised centrally to form a funnel.  The tenosynovial tissue resected proximally with separation of the 2 tendons.  Again no further triggering was noted.  Each of the wounds was irrigated and closed with interrupted 4-0 nylon sutures.  Separate incision was then made in the mid palm longitudinally carried down through subcutaneous tissue.  Bleeders again electrocauterized.  Palmar fascia was split.  Superficial palmar arch identified.  Flexor tendon to the ring and little finger identified.  Retractors were placed protecting neurovascular bundles radially and ulnarly.  Median and ulnar nerves and the flexor retinaculum was incised on its ulnar border. A right angle and  Sewall retractor were placed between the skin and forearm fascia.  The fascia released for approximately 2 cm proximal to the wrist crease under direct vision.  Canal was explored.  Air compression to the nerve was apparent.  The motor branch entered into muscle distally.  The wound was copiously irrigated with saline and the skin closed with 4-0 nylon sutures.  Local infiltration with 0.25% bupivacaine without epinephrine was given and 10 mL total was used for the 3 incisions.  A sterile compressive dressing with  the fingers free was applied.  On deflation of the tourniquet, all fingers were immediately pinked.  She was taken to the recovery room for observation in satisfactory condition.  [facility] will be discharged home [facility].          ______________________________ Cindee SaltGary Beaux Wedemeyer, M.D.     GK/MEDQ  D:  10/04/2014  T:  10/04/2014  Job:  [id number]

## 2014-10-04 NOTE — Discharge Instructions (Signed)
°  Post Anesthesia Home Care Instructions ° °Activity: °Get plenty of rest for the remainder of the day. A responsible adult should stay with you for 24 hours following the procedure.  °For the next 24 hours, DO NOT: °-Drive a car °-Operate machinery °-Drink alcoholic beverages °-Take any medication unless instructed by your physician °-Make any legal decisions or sign important papers. ° °Meals: °Start with liquid foods such as gelatin or soup. Progress to regular foods as tolerated. Avoid greasy, spicy, heavy foods. If nausea and/or vomiting occur, drink only clear liquids until the nausea and/or vomiting subsides. Call your physician if vomiting continues. ° °Special Instructions/Symptoms: °Your throat may feel dry or sore from the anesthesia or the breathing tube placed in your throat during surgery. If this causes discomfort, gargle with warm salt water. The discomfort should disappear within 24 hours. ° °If you had a scopolamine patch placed behind your ear for the management of post- operative nausea and/or vomiting: ° °1. The medication in the patch is effective for 72 hours, after which it should be removed.  Wrap patch in a tissue and discard in the trash. Wash hands thoroughly with soap and water. °2. You may remove the patch earlier than 72 hours if you experience unpleasant side effects which may include dry mouth, dizziness or visual disturbances. °3. Avoid touching the patch. Wash your hands with soap and water after contact with the patch. °  °      HAND SURGERY ° °  HOME CARE INSTRUCTIONS ° ° ° °The following instructions have been prepared to help you care for yourself upon your return home today. ° °Wound Care:  °Keep your hand elevated above the level of your heart. Do not allow it to dangle by your side. Keep the dressing dry and do not remove it unless your doctor advises you to do so. He will usually change it at the time of you post-op visit. Moving your fingers is advised to stimulate  circulation but will depend on the site of your surgery. Of course, if you have a splint applied your doctor will advise you about movement. ° °Activity:  °Do not drive or operate machinery today. Rest today and then you may return to your normal activity and work as indicated by your physician. ° °Diet: °Drink liquids today or eat a light diet. You may resume a regular diet tomorrow. ° °General expectations: °Pain for two or three days. °Fingers may become slightly swollen.  ° °Unexpected Observations- Call your doctor if any of these occur: °Severe pain not relieved by pain medication. °Elevated temperature. °Dressing soaked with blood. °Inability to move fingers. °White or bluish color to fingers. ° ° ° ° ° °

## 2014-10-04 NOTE — Op Note (Signed)
Dictation Number [id number]

## 2014-10-04 NOTE — Anesthesia Postprocedure Evaluation (Signed)
  Anesthesia Post-op Note  Patient: Charlotte Hendricks  Procedure(s) Performed: Procedure(s): RIGHT CARPAL TUNNEL RELEASE (Right) RELEASE A-1 PULLEY RIGHT MIDDLE AND RIGHT RING FINGER (Right)  Patient Location: PACU  Anesthesia Type:MAC and Bier block  Level of Consciousness: awake  Airway and Oxygen Therapy: Patient Spontanous Breathing  Post-op Pain: none  Post-op Assessment: Post-op Vital signs reviewed, Patient's Cardiovascular Status Stable, Respiratory Function Stable, Patent Airway, No signs of Nausea or vomiting and Pain level controlled  Post-op Vital Signs: Reviewed and stable  Last Vitals:  Filed Vitals:   10/04/14 1005  BP: 107/73  Pulse: 73  Temp: 37.2 C  Resp: 18    Complications: No apparent anesthesia complications

## 2014-10-05 ENCOUNTER — Encounter (HOSPITAL_BASED_OUTPATIENT_CLINIC_OR_DEPARTMENT_OTHER): Payer: Self-pay | Admitting: Orthopedic Surgery

## 2015-05-24 ENCOUNTER — Other Ambulatory Visit: Payer: Self-pay | Admitting: Obstetrics & Gynecology

## 2015-05-24 DIAGNOSIS — N6310 Unspecified lump in the right breast, unspecified quadrant: Secondary | ICD-10-CM

## 2015-05-29 ENCOUNTER — Ambulatory Visit
Admission: RE | Admit: 2015-05-29 | Discharge: 2015-05-29 | Disposition: A | Discharge status: Home / Self Care | Payer: Managed Care, Other (non HMO) | Source: Ambulatory Visit | Attending: Obstetrics & Gynecology | Admitting: Obstetrics & Gynecology

## 2015-05-29 ENCOUNTER — Other Ambulatory Visit: Payer: Self-pay | Admitting: Obstetrics & Gynecology

## 2015-05-29 DIAGNOSIS — N6310 Unspecified lump in the right breast, unspecified quadrant: Secondary | ICD-10-CM

## 2015-07-01 DIAGNOSIS — G5602 Carpal tunnel syndrome, left upper limb: Secondary | ICD-10-CM | POA: Insufficient documentation

## 2015-07-30 ENCOUNTER — Other Ambulatory Visit: Payer: Self-pay | Admitting: Orthopedic Surgery

## 2015-08-14 ENCOUNTER — Encounter (HOSPITAL_BASED_OUTPATIENT_CLINIC_OR_DEPARTMENT_OTHER): Payer: Self-pay | Admitting: *Deleted

## 2015-08-31 DIAGNOSIS — M65842 Other synovitis and tenosynovitis, left hand: Secondary | ICD-10-CM

## 2015-08-31 DIAGNOSIS — M65319 Trigger thumb, unspecified thumb: Secondary | ICD-10-CM

## 2015-08-31 HISTORY — DX: Other synovitis and tenosynovitis, left hand: M65.842

## 2015-08-31 HISTORY — DX: Trigger thumb, unspecified thumb: M65.319

## 2015-09-04 ENCOUNTER — Other Ambulatory Visit: Payer: Self-pay | Admitting: Orthopedic Surgery

## 2015-09-05 ENCOUNTER — Encounter (HOSPITAL_BASED_OUTPATIENT_CLINIC_OR_DEPARTMENT_OTHER): Payer: Self-pay | Admitting: *Deleted

## 2015-09-05 NOTE — Pre-Procedure Instructions (Signed)
To come for BMET 

## 2015-10-02 ENCOUNTER — Encounter (HOSPITAL_BASED_OUTPATIENT_CLINIC_OR_DEPARTMENT_OTHER): Payer: Self-pay | Admitting: *Deleted

## 2015-10-09 ENCOUNTER — Encounter (HOSPITAL_BASED_OUTPATIENT_CLINIC_OR_DEPARTMENT_OTHER)
Admission: RE | Admit: 2015-10-09 | Discharge: 2015-10-09 | Disposition: A | Discharge status: Home / Self Care | Payer: Managed Care, Other (non HMO) | Source: Ambulatory Visit | Attending: Orthopedic Surgery | Admitting: Orthopedic Surgery

## 2015-10-09 DIAGNOSIS — Z01812 Encounter for preprocedural laboratory examination: Secondary | ICD-10-CM | POA: Insufficient documentation

## 2015-10-09 LAB — BASIC METABOLIC PANEL
Anion gap: 10 (ref 5–15)
BUN: 8 mg/dL (ref 6–20)
CHLORIDE: 104 mmol/L (ref 101–111)
CO2: 26 mmol/L (ref 22–32)
CREATININE: 0.7 mg/dL (ref 0.44–1.00)
Calcium: 9.6 mg/dL (ref 8.9–10.3)
GFR calc Af Amer: >60 mL/min (ref >60)
GFR calc non Af Amer: >60 mL/min (ref >60)
Glucose, Bld: 143 mg/dL — ABNORMAL HIGH (ref 65–99)
Potassium: 4.5 mmol/L (ref 3.5–5.1)
Sodium: 140 mmol/L (ref 135–145)

## 2015-10-10 ENCOUNTER — Encounter (HOSPITAL_BASED_OUTPATIENT_CLINIC_OR_DEPARTMENT_OTHER): Payer: Self-pay | Admitting: Orthopedic Surgery

## 2015-10-10 ENCOUNTER — Ambulatory Visit (HOSPITAL_BASED_OUTPATIENT_CLINIC_OR_DEPARTMENT_OTHER)
Admission: RE | Admit: 2015-10-10 | Payer: Managed Care, Other (non HMO) | Source: Ambulatory Visit | Admitting: Orthopedic Surgery

## 2015-10-10 ENCOUNTER — Encounter (HOSPITAL_BASED_OUTPATIENT_CLINIC_OR_DEPARTMENT_OTHER): Payer: Self-pay | Admitting: Anesthesiology

## 2015-10-10 HISTORY — DX: Trigger thumb, unspecified thumb: M65.319

## 2015-10-10 HISTORY — DX: Long term (current) use of insulin: Z79.4

## 2015-10-10 HISTORY — DX: Personal history of other specified conditions: Z87.898

## 2015-10-10 HISTORY — DX: Type 2 diabetes mellitus without complications: E11.9

## 2015-10-10 HISTORY — DX: Reserved for inherently not codable concepts without codable children: IMO0001

## 2015-10-10 HISTORY — DX: Other synovitis and tenosynovitis, left hand: M65.842

## 2015-10-10 SURGERY — RELEASE, A1 PULLEY, FOR TRIGGER FINGER
Anesthesia: General | Site: Thumb | Laterality: Right

## 2015-12-16 DIAGNOSIS — M65312 Trigger thumb, left thumb: Secondary | ICD-10-CM | POA: Insufficient documentation

## 2016-06-05 ENCOUNTER — Other Ambulatory Visit: Payer: Self-pay | Admitting: Orthopedic Surgery

## 2016-07-02 ENCOUNTER — Encounter (HOSPITAL_BASED_OUTPATIENT_CLINIC_OR_DEPARTMENT_OTHER): Payer: Self-pay

## 2016-07-02 ENCOUNTER — Ambulatory Visit (HOSPITAL_BASED_OUTPATIENT_CLINIC_OR_DEPARTMENT_OTHER): Admit: 2016-07-02 | Payer: Managed Care, Other (non HMO) | Admitting: Orthopedic Surgery

## 2016-07-02 SURGERY — CARPAL TUNNEL RELEASE
Anesthesia: Monitor Anesthesia Care | Laterality: Left

## 2019-06-12 ENCOUNTER — Other Ambulatory Visit: Payer: Self-pay

## 2019-06-12 ENCOUNTER — Encounter: Payer: Self-pay | Admitting: Family Medicine

## 2019-06-12 ENCOUNTER — Ambulatory Visit: Payer: Self-pay

## 2019-06-12 ENCOUNTER — Ambulatory Visit (INDEPENDENT_AMBULATORY_CARE_PROVIDER_SITE_OTHER): Payer: Commercial Managed Care - PPO | Admitting: Family Medicine

## 2019-06-12 DIAGNOSIS — M7502 Adhesive capsulitis of left shoulder: Secondary | ICD-10-CM

## 2019-06-12 DIAGNOSIS — M25512 Pain in left shoulder: Secondary | ICD-10-CM | POA: Diagnosis not present

## 2019-06-12 DIAGNOSIS — G8929 Other chronic pain: Secondary | ICD-10-CM

## 2019-06-12 NOTE — Progress Notes (Signed)
Office Visit Note   Patient: Charlotte Hendricks           Date of Birth: 1975/09/11           MRN: [id number] Visit Date: 06/12/2019 Requested by: Joycelyn Rua, MD [address] [place],  [place] [address] PCP: Joycelyn Rua, MD  Subjective: Chief Complaint  Patient presents with  . Left Shoulder - Pain    Left shoulder pain since 11/2018 - unsure of any specific injury, but was doing a lot of yardwork. Been seeing Dr. Karenann Cai - has helped relieve some of the pain. At night, pain may radiate down to the elbow - wakes her up.    HPI: She is a right-hand-dominant female seen at the request of Dr. Juluis Pitch for left shoulder pain.  No injury, she started noticing pain in the shoulder when she woke up 1 morning in July.  She noticed that she could not reach very far without feeling a jolt of pain.  She had been doing some yard work the day before but there was never a moment of injury where she thought she had heard something.  Her pain got progressively worse so she went to Dr. Juluis Pitch who did some manipulations and her pain did improve, but before continuing he thought she should be evaluated for rotator cuff pathology.  Denies any numbness or tingling in her arm.  Patient has been type I diabetic since age 56 and her diabetes has not been under good control, A1c has been in the eights.  Denies any retinopathy, nephropathy or neuropathy troubles.              ROS: No fevers or chills.  All other systems were reviewed and are negative.  Objective: Vital Signs: There were no vitals taken for this visit.  Physical Exam:  General:  Alert and oriented, in no acute distress. Pulm:  Breathing unlabored. Psy:  Normal mood, congruent affect. Skin: No visible rash. Left shoulder: She has abduction limited to 45 degrees, external rotation probably 30 degrees and internal rotation of about 45 degrees.  She has pain at the extremes.  Right shoulder range of motion is 110 degrees abduction, 130  degrees external rotation and 45 degrees internal rotation.  Isometric rotator cuff strength is 5/5 throughout with no significant pain.  No tenderness at the [facility] joint or the long head biceps tendon.  Slight tenderness in the posterior subacromial space.  Imaging: X-rays left shoulder: Normal anatomic alignment.  She has very slight narrowing of the [facility] joint, and possibly early spurring at the inferior aspect of the glenoid suggesting mild osteoarthritis.    Assessment & Plan: 1.  Left shoulder pain due to adhesive capsulitis, probably spontaneous and related to underlying diabetes. -Home stretches given.  She will continue working with Dr. Juluis Pitch.  Anti-inflammatories as needed.  If she reaches a plateau, we will do a one-time ultrasound-guided glenohumeral injection.     Procedures: No procedures performed  No notes on file     PMFS History: Patient Active Problem List   Diagnosis Date Noted  . Trigger finger of left thumb 12/16/2015  . Carpal tunnel syndrome on left 07/01/2015  . Preterm premature rupture of membranes (PPROM) delivered, current hospitalization 11/20/2011  . DM type 1 (diabetes mellitus, type 1) ([facility]) 11/20/2011  . Polyhydramnios affecting fetus or newborn 11/20/2011  . S/P cesarean section 11/20/2011  . Maternal condition affecting fetus or newborn 09/15/2011  . Hypothyroidism 06/04/2009   Past  Medical History:  Diagnosis Date  . History of seizure age 9   x 1 - due to low blood sugar  . Insulin dependent diabetes mellitus   . Stenosing tenosynovitis of finger of left hand 08/2015   middle finger  . Stenosing tenosynovitis of thumb 08/2015   bilateral    Family History  Problem Relation Age of Onset  . Hyperlipidemia Father     Past Surgical History:  Procedure Laterality Date  . BREAST BIOPSY     2008  . CARPAL TUNNEL RELEASE Right 10/04/2014   Procedure: RIGHT CARPAL TUNNEL RELEASE;  Surgeon: Daryll Brod, MD;  Location: [facility];   Service: Orthopedics;  Laterality: Right;  . CESAREAN SECTION  2008,01/21/2009; 12/24/2009, 2013   total of 4   . CESAREAN SECTION  11/20/2011   Procedure: CESAREAN SECTION;  Surgeon: Thornell Sartorius, MD;  Location: [facility];  Service: Gynecology;  Laterality: N/A;  . TRIGGER FINGER RELEASE Right 10/04/2014   Procedure: RELEASE A-1 PULLEY RIGHT MIDDLE AND RIGHT RING FINGER;  Surgeon: Daryll Brod, MD;  Location: [facility];  Service: Orthopedics;  Laterality: Right;  . WISDOM TOOTH EXTRACTION     Social History   Occupational History  . Not on file  Tobacco Use  . Smoking status: Never Smoker  . Smokeless tobacco: Never Used  Substance and Sexual Activity  . Alcohol use: No  . Drug use: No  . Sexual activity: Yes    Birth control/protection: None

## 2019-08-08 ENCOUNTER — Ambulatory Visit: Payer: Self-pay

## 2019-08-08 ENCOUNTER — Ambulatory Visit (INDEPENDENT_AMBULATORY_CARE_PROVIDER_SITE_OTHER): Payer: Commercial Managed Care - PPO | Admitting: Family Medicine

## 2019-08-08 ENCOUNTER — Other Ambulatory Visit: Payer: Self-pay

## 2019-08-08 ENCOUNTER — Encounter: Payer: Self-pay | Admitting: Family Medicine

## 2019-08-08 DIAGNOSIS — M7502 Adhesive capsulitis of left shoulder: Secondary | ICD-10-CM

## 2019-08-08 DIAGNOSIS — G8929 Other chronic pain: Secondary | ICD-10-CM | POA: Diagnosis not present

## 2019-08-08 DIAGNOSIS — M25512 Pain in left shoulder: Secondary | ICD-10-CM

## 2019-08-08 MED ORDER — MELOXICAM 15 MG PO TABS: 7.5000 mg | ORAL_TABLET | Freq: Every day | ORAL | 6 refills | Status: AC | PRN

## 2019-08-08 NOTE — Progress Notes (Signed)
I saw and examined the patient with Dr. Robby Sermon and agree with assessment and plan as outlined.    Still having pain and decreased ROM.  Will inject glenohumeral joint today.  Meloxicam as needed.  Continue with therapy per Dr. Karenann Cai.

## 2019-08-08 NOTE — Progress Notes (Signed)
Charlotte Hendricks - 44 y.o. female MRN [id number]  Date of birth: 07/10/1975  Office Visit Note: Visit Date: 08/08/2019 PCP: Joycelyn Rua, MD Referred by: Joycelyn Rua, MD  Subjective: Chief Complaint  Patient presents with  . Left Shoulder - Pain    Has not regained much, if any, ROM since last visit. Not much pain during the day, but it does ache at night.   HPI: Charlotte Hendricks is a 44 y.o. female who comes in today for follow up of left shoulder pain.  She was last seen on 06/12/2019, diagnosed with adhesive capsulitis. She has been working with chiropractor Dr. Karenann Cai. Feels like her ROM is about the same. She is not having much pain during the day but her shoulder aches at night and wakes her up from sleep.   She has T1DM- diabetes was not under good control as she did not have a working sensor but she is currently on pump and now has a working sensor.  She reports that she has had sensitivity to lidocaine in the past where she felt a bit lightheaded and had to sit down.    ROS Otherwise per HPI.  Assessment & Plan: Visit Diagnoses:  1. Chronic left shoulder pain   2. Adhesive capsulitis of left shoulder     Plan: Adhesive capsulitis of left shoulder. Given minimal relief with rehab, patient elected for glenohumeral injection of left shoulder with good anesthetic relief and increased ROM after injection. Continue working on ROM exercises and manipulation with chiropractor. Return PRN.  Meds & Orders:  Meds ordered this encounter  Medications  . meloxicam (MOBIC) 15 MG tablet    Sig: Take 0.5-1 tablets (7.5-15 mg total) by mouth daily as needed for pain.    Dispense:  30 tablet    Refill:  6    Orders Placed This Encounter  Procedures  . US Guided Needle Placement    Follow-up: No follow-ups on file.   Procedures: Procedure performed: Glenohumeral corticosteroid injection, ultrasound-guided  Ultrasound-guided left glenohumeral injection: After sterile prep with  Betadine, injected 8 cc 1% lidocaine without epinephrine and 40 mg methylprednisolone using a 22-gauge spinal needle, passing the needle through approach into the glenohumeral joint.  Injectate was seen filling the joint capsule.  She had good immediate relief during the anesthetic phase and improved ROM.      Clinical History: No specialty comments available.   She reports that she has never smoked. She has never used smokeless tobacco. No results for input(s): HGBA1C, LABURIC in the last 8760 hours.  Objective:  VS:  HT:    WT:   BMI:     BP:   HR: bpm  TEMP: ( )  RESP:  Physical Exam  PHYSICAL EXAM: Gen: NAD, alert, cooperative with exam, well-appearing HEENT: clear conjunctiva,  CV:  no edema, capillary refill brisk, normal rate Resp: non-labored Skin: no rashes, normal turgor  Neuro: no gross deficits.  Psych:  alert and oriented  Ortho Exam  Left Shoulder: Inspection reveals no obvious deformity, atrophy, or asymmetry. No bruising. No swelling Palpation: TTP in subacromial space. ER: limited to 30 degrees Abduction: limited to 60 degrees Flexion: 60 degrees IR: 45 degrees NV intact distally  Imaging: US Guided Needle Placement  Result Date: 08/08/2019 Please see Notes tab for imaging impression.   Past Medical/Family/Surgical/Social History: Medications & Allergies reviewed per EMR, new medications updated. Patient Active Problem List   Diagnosis Date Noted  . Trigger finger of left thumb 12/16/2015  .  Carpal tunnel syndrome on left 07/01/2015  . Preterm premature rupture of membranes (PPROM) delivered, current hospitalization 11/20/2011  . DM type 1 (diabetes mellitus, type 1) ([facility]) 11/20/2011  . Polyhydramnios affecting fetus or newborn 11/20/2011  . S/P cesarean section 11/20/2011  . Maternal condition affecting fetus or newborn 09/15/2011  . Hypothyroidism 06/04/2009   Past Medical History:  Diagnosis Date  . History of seizure age 62   x 1 - due to  low blood sugar  . Insulin dependent diabetes mellitus   . Stenosing tenosynovitis of finger of left hand 08/2015   middle finger  . Stenosing tenosynovitis of thumb 08/2015   bilateral   Family History  Problem Relation Age of Onset  . Hyperlipidemia Father    Past Surgical History:  Procedure Laterality Date  . BREAST BIOPSY     2008  . CARPAL TUNNEL RELEASE Right 10/04/2014   Procedure: RIGHT CARPAL TUNNEL RELEASE;  Surgeon: Daryll Brod, MD;  Location: [facility];  Service: Orthopedics;  Laterality: Right;  . CESAREAN SECTION  2008,01/21/2009; 12/24/2009, 2013   total of 4   . CESAREAN SECTION  11/20/2011   Procedure: CESAREAN SECTION;  Surgeon: Thornell Sartorius, MD;  Location: [facility];  Service: Gynecology;  Laterality: N/A;  . TRIGGER FINGER RELEASE Right 10/04/2014   Procedure: RELEASE A-1 PULLEY RIGHT MIDDLE AND RIGHT RING FINGER;  Surgeon: Daryll Brod, MD;  Location: [facility];  Service: Orthopedics;  Laterality: Right;  . WISDOM TOOTH EXTRACTION     Social History   Occupational History  . Not on file  Tobacco Use  . Smoking status: Never Smoker  . Smokeless tobacco: Never Used  Substance and Sexual Activity  . Alcohol use: No  . Drug use: No  . Sexual activity: Yes    Birth control/protection: None
